# Patient Record
Sex: Female | Born: 1955 | Race: White | Hispanic: No | Smoking: Never smoker
Health system: Southern US, Community
[De-identification: ages and names within clinical notes are randomized; demographics above are authoritative.]

## PROBLEM LIST (undated history)

## (undated) DIAGNOSIS — G5600 Carpal tunnel syndrome, unspecified upper limb: Secondary | ICD-10-CM

## (undated) DIAGNOSIS — G609 Hereditary and idiopathic neuropathy, unspecified: Secondary | ICD-10-CM

## (undated) DIAGNOSIS — M1990 Unspecified osteoarthritis, unspecified site: Secondary | ICD-10-CM

## (undated) DIAGNOSIS — I10 Essential (primary) hypertension: Secondary | ICD-10-CM

## (undated) DIAGNOSIS — E785 Hyperlipidemia, unspecified: Secondary | ICD-10-CM

## (undated) HISTORY — PX: COLONOSCOPY: SHX174

## (undated) HISTORY — PX: TUBAL LIGATION: SHX77

---

## 2013-03-20 ENCOUNTER — Encounter: Payer: Self-pay | Admitting: Family Medicine

## 2017-06-14 ENCOUNTER — Other Ambulatory Visit: Payer: Self-pay | Admitting: Physician Assistant

## 2017-06-14 DIAGNOSIS — M2391 Unspecified internal derangement of right knee: Secondary | ICD-10-CM

## 2017-06-21 ENCOUNTER — Ambulatory Visit: Payer: BLUE CROSS/BLUE SHIELD

## 2017-06-28 ENCOUNTER — Ambulatory Visit: Payer: BLUE CROSS/BLUE SHIELD

## 2017-10-26 ENCOUNTER — Other Ambulatory Visit: Payer: Self-pay | Admitting: Obstetrics & Gynecology

## 2017-10-26 DIAGNOSIS — Z1231 Encounter for screening mammogram for malignant neoplasm of breast: Secondary | ICD-10-CM

## 2017-11-17 ENCOUNTER — Ambulatory Visit
Admission: RE | Admit: 2017-11-17 | Discharge: 2017-11-17 | Disposition: A | Discharge status: Home / Self Care | Payer: BLUE CROSS/BLUE SHIELD | Source: Ambulatory Visit | Attending: Obstetrics & Gynecology | Admitting: Obstetrics & Gynecology

## 2017-11-17 DIAGNOSIS — Z1231 Encounter for screening mammogram for malignant neoplasm of breast: Secondary | ICD-10-CM | POA: Diagnosis not present

## 2017-11-19 ENCOUNTER — Other Ambulatory Visit: Payer: Self-pay | Admitting: *Deleted

## 2017-11-19 ENCOUNTER — Inpatient Hospital Stay
Admission: RE | Admit: 2017-11-19 | Discharge: 2017-11-19 | Disposition: A | Discharge status: Home / Self Care | Payer: Self-pay | Source: Ambulatory Visit | Attending: *Deleted | Admitting: *Deleted

## 2017-11-19 DIAGNOSIS — Z9289 Personal history of other medical treatment: Secondary | ICD-10-CM

## 2018-11-29 ENCOUNTER — Other Ambulatory Visit: Payer: Self-pay | Admitting: Obstetrics & Gynecology

## 2018-11-29 DIAGNOSIS — Z1231 Encounter for screening mammogram for malignant neoplasm of breast: Secondary | ICD-10-CM

## 2018-12-19 ENCOUNTER — Ambulatory Visit
Admission: RE | Admit: 2018-12-19 | Discharge: 2018-12-19 | Disposition: A | Discharge status: Home / Self Care | Payer: BLUE CROSS/BLUE SHIELD | Source: Ambulatory Visit | Attending: Obstetrics & Gynecology | Admitting: Obstetrics & Gynecology

## 2018-12-19 DIAGNOSIS — Z1231 Encounter for screening mammogram for malignant neoplasm of breast: Secondary | ICD-10-CM | POA: Insufficient documentation

## 2019-10-30 ENCOUNTER — Other Ambulatory Visit: Payer: Self-pay

## 2019-10-30 DIAGNOSIS — Z20822 Contact with and (suspected) exposure to covid-19: Secondary | ICD-10-CM

## 2019-10-31 LAB — NOVEL CORONAVIRUS, NAA: SARS-CoV-2, NAA: NOT DETECTED

## 2019-10-31 LAB — INPATIENT

## 2020-10-16 ENCOUNTER — Other Ambulatory Visit: Payer: Self-pay | Admitting: Family Medicine

## 2020-10-16 DIAGNOSIS — Z1231 Encounter for screening mammogram for malignant neoplasm of breast: Secondary | ICD-10-CM

## 2020-11-28 ENCOUNTER — Ambulatory Visit
Admission: RE | Admit: 2020-11-28 | Discharge: 2020-11-28 | Disposition: A | Discharge status: Home / Self Care | Payer: Medicare Other | Source: Ambulatory Visit | Attending: Family Medicine | Admitting: Family Medicine

## 2020-11-28 ENCOUNTER — Other Ambulatory Visit: Payer: Self-pay

## 2020-11-28 DIAGNOSIS — Z1231 Encounter for screening mammogram for malignant neoplasm of breast: Secondary | ICD-10-CM | POA: Insufficient documentation

## 2021-12-11 ENCOUNTER — Other Ambulatory Visit: Payer: Self-pay | Admitting: Family Medicine

## 2021-12-11 DIAGNOSIS — Z1231 Encounter for screening mammogram for malignant neoplasm of breast: Secondary | ICD-10-CM

## 2021-12-24 ENCOUNTER — Other Ambulatory Visit: Payer: Self-pay

## 2021-12-24 ENCOUNTER — Ambulatory Visit
Admission: RE | Admit: 2021-12-24 | Discharge: 2021-12-24 | Disposition: A | Discharge status: Home / Self Care | Payer: Medicare Other | Source: Ambulatory Visit | Attending: Family Medicine | Admitting: Family Medicine

## 2021-12-24 DIAGNOSIS — Z1231 Encounter for screening mammogram for malignant neoplasm of breast: Secondary | ICD-10-CM | POA: Insufficient documentation

## 2022-01-25 NOTE — Discharge Instructions (Signed)
Instructions after Total Knee Replacement   Lashe Oliveira P. Mychelle Kendra, Jr., M.D.     Dept. of Orthopaedics & Sports Medicine  [facility]  [address]  [place], [place]  [address]  Phone: [phone]   Fax: [phone]    DIET: Drink plenty of non-alcoholic fluids. Resume your normal diet. Include foods high in fiber.  ACTIVITY:  You may use crutches or a walker with weight-bearing as tolerated, unless instructed otherwise. You may be weaned off of the walker or crutches by your Physical Therapist.  Do NOT place pillows under the knee. Anything placed under the knee could limit your ability to straighten the knee.   Continue doing gentle exercises. Exercising will reduce the pain and swelling, increase motion, and prevent muscle weakness.   Please continue to use the TED compression stockings for 6 weeks. You may remove the stockings at night, but should reapply them in the morning. Do not drive or operate any equipment until instructed.  WOUND CARE:  Continue to use the PolarCare or ice packs periodically to reduce pain and swelling. You may bathe or shower after the staples are removed at the first office visit following surgery.  MEDICATIONS: You may resume your regular medications. Please take the pain medication as prescribed on the medication. Do not take pain medication on an empty stomach. You have been given a prescription for a blood thinner (Lovenox or Coumadin). Please take the medication as instructed. (NOTE: After completing a 2 week course of Lovenox, take one Enteric-coated aspirin once a day. This along with elevation will help reduce the possibility of phlebitis in your operated leg.) Do not drive or drink alcoholic beverages when taking pain medications.  CALL THE OFFICE FOR: Temperature above 101 degrees Excessive bleeding or drainage on the dressing. Excessive swelling, coldness, or paleness of the toes. Persistent nausea and vomiting.  FOLLOW-UP:  You  should have an appointment to return to the office in 10-14 days after surgery. Arrangements have been made for continuation of Physical Therapy (either home therapy or outpatient therapy).   [facility] Department Directory         [email or web address]       [email or web address]          Cardiology  Appointments: [place] - [phone] [place] - [phone]  Endocrinology  Appointments: [place] - [phone] [place] - [phone]  Gastroenterology  Appointments: [place] - [phone] [place] - [phone]        General Surgery   Appointments: [place] - [phone]  Internal Medicine/Family Medicine  Appointments: [place] - [phone] [place] - [phone] [place] - [phone]  Metabolic and Weigh Loss Surgery  Appointments: [place] - [phone]        Neurology  Appointments: [place] - [phone] [place] - [phone]  Neurosurgery  Appointments: [place] - [phone]  Obstetrics & Gynecology  Appointments: [place] - [phone] [place] - [phone]        Pediatrics  Appointments: [place] - [phone] [place] - [phone]  Physiatry  Appointments: [place] -[phone]  Physical Therapy  Appointments: [place] - [phone] [place] - [phone]        Podiatry  Appointments: [place] - [phone] [place] - [phone]  Pulmonology  Appointments: [place] - [phone]  Rheumatology  Appointments: [place] - [phone]        [place] Location: [facility]  [address] [place], [place]  [address]  [place] Location: [facility] [address] [place], [place]  [address]  [place] Location: [facility] [address] [place], [place]  [address]    

## 2022-02-02 ENCOUNTER — Encounter: Payer: Self-pay | Admitting: Orthopedic Surgery

## 2022-02-02 ENCOUNTER — Other Ambulatory Visit
Admission: RE | Admit: 2022-02-02 | Discharge: 2022-02-02 | Disposition: A | Discharge status: Home / Self Care | Payer: Medicare Other | Source: Ambulatory Visit | Attending: Orthopedic Surgery | Admitting: Orthopedic Surgery

## 2022-02-02 ENCOUNTER — Other Ambulatory Visit: Payer: Self-pay

## 2022-02-02 VITALS — BP 151/83 | HR 72 | Temp 98.6°F | Resp 16 | Ht 61.0 in | Wt 185.9 lb

## 2022-02-02 DIAGNOSIS — Z01818 Encounter for other preprocedural examination: Secondary | ICD-10-CM | POA: Insufficient documentation

## 2022-02-02 DIAGNOSIS — M1711 Unilateral primary osteoarthritis, right knee: Secondary | ICD-10-CM | POA: Insufficient documentation

## 2022-02-02 DIAGNOSIS — Z01812 Encounter for preprocedural laboratory examination: Secondary | ICD-10-CM

## 2022-02-02 HISTORY — DX: Unspecified osteoarthritis, unspecified site: M19.90

## 2022-02-02 LAB — CBC
HCT: 45.1 % (ref 36.0–46.0)
Hemoglobin: 15.4 g/dL — ABNORMAL HIGH (ref 12.0–15.0)
MCH: 32 pg (ref 26.0–34.0)
MCHC: 34.1 g/dL (ref 30.0–36.0)
MCV: 93.6 fL (ref 80.0–100.0)
Platelets: 229 10*3/uL (ref 150–400)
RBC: 4.82 MIL/uL (ref 3.87–5.11)
RDW: 11.9 % (ref 11.5–15.5)
WBC: 7.2 10*3/uL (ref 4.0–10.5)
nRBC: 0 % (ref 0.0–0.2)

## 2022-02-02 LAB — COMPREHENSIVE METABOLIC PANEL
ALT: 23 U/L (ref 0–44)
AST: 24 U/L (ref 15–41)
Albumin: 4.5 g/dL (ref 3.5–5.0)
Alkaline Phosphatase: 54 U/L (ref 38–126)
Anion gap: 8 (ref 5–15)
BUN: 20 mg/dL (ref 8–23)
CO2: 27 mmol/L (ref 22–32)
Calcium: 9.2 mg/dL (ref 8.9–10.3)
Chloride: 103 mmol/L (ref 98–111)
Creatinine, Ser: 0.57 mg/dL (ref 0.44–1.00)
GFR, Estimated: >60 mL/min (ref >60)
Glucose, Bld: 111 mg/dL — ABNORMAL HIGH (ref 70–99)
Potassium: 3.9 mmol/L (ref 3.5–5.1)
Sodium: 138 mmol/L (ref 135–145)
Total Bilirubin: 0.9 mg/dL (ref 0.3–1.2)
Total Protein: 7.7 g/dL (ref 6.5–8.1)

## 2022-02-02 LAB — URINALYSIS, ROUTINE W REFLEX MICROSCOPIC
Bacteria, UA: NONE SEEN
Bilirubin Urine: NEGATIVE
Glucose, UA: NEGATIVE mg/dL
Hgb urine dipstick: NEGATIVE
Ketones, ur: NEGATIVE mg/dL
Nitrite: NEGATIVE
Protein, ur: NEGATIVE mg/dL
Specific Gravity, Urine: 1.015 (ref 1.005–1.030)
pH: 5 (ref 5.0–8.0)

## 2022-02-02 LAB — TYPE AND SCREEN
ABO/RH(D): A POS
Antibody Screen: NEGATIVE

## 2022-02-02 LAB — SEDIMENTATION RATE: Sed Rate: 4 mm/hr (ref 0–30)

## 2022-02-02 LAB — SURGICAL PCR SCREEN
MRSA, PCR: NEGATIVE
Staphylococcus aureus: NEGATIVE

## 2022-02-02 LAB — C-REACTIVE PROTEIN: CRP: 0.5 mg/dL (ref <1.0)

## 2022-02-02 NOTE — Patient Instructions (Addendum)
Your procedure is scheduled on: Friday February 13, 2022. Report to Day Surgery inside [facility] 2nd floor, stop by admissions desk before getting on elevator. To find out your arrival time please call [phone] between 1PM - 3PM on Thursday February 12, 2022.  Remember: Instructions that are not followed completely may result in serious medical risk,  up to and including death, or upon the discretion of your surgeon and anesthesiologist your  surgery may need to be rescheduled.     _X__ 1. Do not eat food after midnight the night before your procedure.                 No chewing gum or hard candies. You may drink clear liquids up to 2 hours                 before you are scheduled to arrive for your surgery- DO not drink clear                 liquids within 2 hours of the start of your surgery.                 Clear Liquids include:  water, apple juice without pulp, clear Gatorade, G2 or                  Gatorade Zero (avoid Red/Purple/Blue), Black Coffee or Tea (Do not add                 anything to coffee or tea).  __X__2.   Complete the "Ensure Clear Pre-surgery Clear Carbohydrate Drink" provided to you, 2 hours before arrival. **If you are diabetic you will be provided with an alternative drink, Gatorade Zero or G2.  __X__3.  On the morning of surgery brush your teeth with toothpaste and water, you                may rinse your mouth with mouthwash if you wish.  Do not swallow any toothpaste of mouthwash.     _X__ 4.  No Alcohol for 24 hours before or after surgery.   _X__ 5.  Do Not Smoke or use e-cigarettes For 24 Hours Prior to Your Surgery.                 Do not use any chewable tobacco products for at least 6 hours prior to                 Surgery.  _X__  6.  Do not use any recreational drugs (marijuana, cocaine, heroin, ecstasy, MDMA or other)                For at least one week prior to your surgery.  Combination of these drugs with anesthesia                 May have life threatening results.  ____  7.  Bring all medications with you on the day of surgery if instructed.   __X__  8.  Notify your doctor if there is any change in your medical condition      (cold, fever, infections).     Do not wear jewelry, make-up, hairpins, clips or nail polish. Do not wear lotions, powders, or perfumes. You may wear deodorant. Do not shave 48 hours prior to surgery. Men may shave face and neck. Do not bring valuables to the hospital.    [facility] is not responsible for any belongings or valuables.  Contacts, dentures or bridgework may not be worn into surgery. Leave your suitcase in the car. After surgery it may be brought to your room. For patients admitted to the hospital, discharge time is determined by your treatment team.   Patients discharged the day of surgery will not be allowed to drive home.   Make arrangements for someone to be with you for the first 24 hours of your Same Day Discharge.    Please read over the following fact sheets that you were given:   Total Joint Packet    __X__ Take these medicines the morning of surgery with A SIP OF WATER:    1. metoprolol succinate (TOPROL-XL) 25 MG   2. oxybutynin (DITROPAN-XL) 10 MG  3.   4.  5.  6.  ____ Fleet Enema (as directed)   __X__ Use CHG Soap (or wipes) as directed  ____ Use Benzoyl Peroxide Gel as instructed  ____ Use inhalers on the day of surgery  ____ Stop metformin 2 days prior to surgery    ____ Take 1/2 of usual insulin dose the night before surgery. No insulin the morning          of surgery.   ____ Call your PCP, cardiologist, or Pulmonologist if taking Coumadin/Plavix/aspirin and ask when to stop before your surgery.   __X__ One Week prior to surgery- Stop Anti-inflammatories such as Ibuprofen, Aleve, Advil, Motrin, meloxicam (MOBIC), diclofenac, etodolac, ketorolac, Toradol, Daypro, piroxicam, Goody's or BC powders. OK TO USE TYLENOL IF  NEEDED   __X__ Do not start any vitamins and or supplements until after surgery.    ____ Bring C-Pap to the hospital.    If you have any questions regarding your pre-procedure instructions,  Please call Pre-admit Testing at [phone]

## 2022-02-11 ENCOUNTER — Other Ambulatory Visit: Payer: Self-pay

## 2022-02-11 ENCOUNTER — Other Ambulatory Visit
Admission: RE | Admit: 2022-02-11 | Discharge: 2022-02-11 | Disposition: A | Discharge status: Home / Self Care | Payer: Medicare Other | Source: Ambulatory Visit | Attending: Orthopedic Surgery | Admitting: Orthopedic Surgery

## 2022-02-11 DIAGNOSIS — Z01812 Encounter for preprocedural laboratory examination: Secondary | ICD-10-CM | POA: Diagnosis present

## 2022-02-11 DIAGNOSIS — Z20822 Contact with and (suspected) exposure to covid-19: Secondary | ICD-10-CM | POA: Diagnosis not present

## 2022-02-11 LAB — SARS CORONAVIRUS 2 (TAT 6-24 HRS): SARS Coronavirus 2: NEGATIVE

## 2022-02-12 ENCOUNTER — Encounter: Payer: Self-pay | Admitting: Orthopedic Surgery

## 2022-02-12 DIAGNOSIS — E785 Hyperlipidemia, unspecified: Secondary | ICD-10-CM | POA: Insufficient documentation

## 2022-02-12 DIAGNOSIS — I10 Essential (primary) hypertension: Secondary | ICD-10-CM | POA: Insufficient documentation

## 2022-02-12 DIAGNOSIS — R0609 Other forms of dyspnea: Secondary | ICD-10-CM | POA: Insufficient documentation

## 2022-02-12 NOTE — H&P (Signed)
ORTHOPAEDIC HISTORY & PHYSICAL ?Wanda Lynch [place], [place] - 02/05/2022 9:45 AM EST ?Formatting of this note is different from the original. ?[place] ?ORTHOPAEDICS AND SPORTS MEDICINE ?Chief Complaint:  ? ?Chief Complaint  ?Patient presents with  ? Knee Pain  ?H & P RIGHT KNEE  ? ?History of Present Illness:  ? ?Wanda Lynch is a 66 y.o. female that presents to clinic today for her preoperative history and evaluation. Patient presents unaccompanied. The patient is scheduled to undergo a right total knee arthroplasty on 02/13/22 by Dr. Marry Guan. Her pain began several years ago. The pain is located primarily along the lateral aspect of the knee. She describes her pain as worse with weightbearing. She reports associated swelling with some giving way of the knee. She denies associated numbness or tingling, denies locking.  ? ?The patient's symptoms have progressed to the point that they decrease her quality of life. The patient has previously undergone conservative treatment including NSAIDS and injections to the knee without adequate control of her symptoms. ? ?Denies history of lumbar surgery, history of DVT. She does see Dr. Saralyn Pilar in cardiology for hypertension. She states her daughter will be coming to stay with her after surgery. ? ?Of note, patient did suffer a right ankle sprain on 11/24/22. She reports some mild discomfort in the right ankle but states it is improved from her initial injury. She is not wearing the brace at this time.  ? ?Past Medical, Surgical, Family, Social History, Allergies, Medications:  ? ?Past Medical History:  ?Past Medical History:  ?Diagnosis Date  ? Allergic state  ? Arthritis  ?Hands/carpal tunnel  ? Carpal tunnel syndrome  ? Exertional dyspnea  ? Hypertension  ?Mild  ? Other and unspecified hyperlipidemia  ? Unspecified hereditary and idiopathic peripheral neuropathy  ? Urinary frequency  ? ?Past Surgical History:  ?Past Surgical History:  ?Procedure Laterality Date   ? TONSILLECTOMY  ? TUBAL LIGATION  ? ?Current Medications:  ?Current Outpatient Medications  ?Medication Sig Dispense Refill  ? acetaminophen (TYLENOL) 650 MG ER tablet Take 1,300 mg by mouth every 8 (eight) hours as needed for Pain  ? calcium carbonate-vitamin D3 (OS-CAL 500+D) 500 mg(1,250mg ) -200 unit tablet Take 1 tablet by mouth 2 (two) times daily with meals  ? ibuprofen (MOTRIN) 200 MG tablet Take 1 tablet (200 mg total) by mouth every 6 (six) hours as needed for Pain  ? ketotifen (ZADITOR) 0.025 % (0.035 %) ophthalmic solution Apply 1 drop to eye once daily  ? meloxicam (MOBIC) 7.5 MG tablet Take 1 tablet (7.5 mg total) by mouth 2 (two) times daily 180 tablet 3  ? methocarbamoL (ROBAXIN) 750 MG tablet Take 750 mg by mouth 4 (four) times daily  ? metoprolol succinate (TOPROL-XL) 50 MG XL tablet Take 1 tablet (50 mg total) by mouth once daily 90 tablet 3  ? oxybutynin (DITROPAN-XL) 10 MG XL tablet TAKE ONE TABLET BY MOUTH DAILY 90 tablet 3  ? peg 400-propylene glycol (SYSTANE ULTRA) 0.4-0.3 % drops Place 1 drop into both eyes once daily  ? propylhexedrine (BENZEDREX) Inha Place 1 spray into one nostril once as needed  ? simvastatin (ZOCOR) 20 MG tablet Take 1 tablet (20 mg total) by mouth once daily 90 tablet 3  ? ?No current facility-administered medications for this visit.  ? ?Allergies:  ?Allergies  ?Allergen Reactions  ? Mometasone Other (See Comments)  ?Nose irritation ?Other reaction(s): Other (See Comments) ?Nose irritation  ? ?Social History:  ?Social History  ? ?  Socioeconomic History  ? Marital status: Widowed  ? Number of children: 3  ?Occupational History  ? Occupation: Retired  ?Tobacco Use  ? Smoking status: Never  ? Smokeless tobacco: Never  ?Vaping Use  ? Vaping Use: Never used  ?Substance and Sexual Activity  ? Alcohol use: No  ?Alcohol/week: 0.0 standard drinks  ? Drug use: No  ? Sexual activity: Defer  ?Partners: Male  ?Birth control/protection: Surgical  ? ?Family History:  ?Family History   ?Problem Relation Age of Onset  ? High blood pressure (Hypertension) Mother  ? Osteoporosis (Thinning of bones) Mother  ? Diabetes type II Mother  ? Hyperlipidemia (Elevated cholesterol) Mother  ? Atrial fibrillation (Abnormal heart rhythm sometimes requiring treatment with blood thinners) Father  ? ?Review of Systems:  ? ?A 10+ ROS was performed, reviewed, and the pertinent orthopaedic findings are documented in the HPI.  ? ?Physical Examination:  ? ?BP (!) 144/78 (BP Location: Left upper arm, Patient Position: Sitting, BP Cuff Size: Large Adult)  Ht 157.5 cm (5\' 2" )  Wt 84.2 kg (185 lb 9.6 oz)  BMI 33.95 kg/m?  ? ?Patient is a well-developed, well-nourished female in no acute distress. Patient has normal mood and affect. Patient is alert and oriented to person, place, and time.  ? ?HEENT: Atraumatic, normocephalic. Pupils equal and reactive to light. Extraocular motion intact. Noninjected sclera. ? ?Cardiovascular: Regular rate and rhythm, with no murmurs, rubs, or gallops. Distal pulses palpable. ? ?Respiratory: Lungs clear to auscultation bilaterally.  ? ?Right Knee: ?Soft tissue swelling: mild ?Effusion: none ?Erythema: none ?Crepitance: mild ?Tenderness: lateral ?Alignment: relative valgus ?Mediolateral laxity: lateral pseudolaxity ?Posterior sag: negative ?Patellar tracking: Good tracking without evidence of subluxation or tilt ?Atrophy: No significant atrophy.  ?Quadriceps tone was fair to good. ?Range of motion: 0/3/118 degrees ? ?Sensation intact over the saphenous, lateral sural cutaneous, superficial fibular, and deep fibular nerve distributions. ? ?Tests Performed/Reviewed:  ?X-rays ? ?Anteroposterior, lateral, and sunrise views of the right knee were obtained. Images reveal severe loss of lateral compartment joint space with significant osteophyte formation. No fractures or osseous abnormalities noted.  ? ?I personally ordered and interpreted today's xrays.  ? ?Impression:  ? ?ICD-10-CM  ?1.  Primary osteoarthritis of right knee M17.11  ? ?Plan:  ? ?The patient has end-stage degenerative changes of the right knee. It was explained to the patient that the condition is progressive in nature. Having failed conservative treatment, the patient has elected to proceed with a total joint arthroplasty. The patient will undergo a total joint arthroplasty with Dr. Marry Guan. The risks of surgery, including blood clot and infection, were discussed with the patient. Measures to reduce these risks, including the use of anticoagulation, perioperative antibiotics, and early ambulation were discussed. The importance of postoperative physical therapy was discussed with the patient. The patient elects to proceed with surgery. The patient is instructed to stop all blood thinners prior to surgery. The patient is instructed to call the hospital the day before surgery to learn of the proper arrival time.  ? ?Contact our office with any questions or concerns. Follow up as indicated, or sooner should any new problems arise, if conditions worsen, or if they are otherwise concerned.  ? ?Gwenlyn Fudge, PA -C ?[facility] ?[address] ?[place], [place] [address] ?Phone: [phone] ? ?This note was generated in part with voice recognition software and I apologize for any typographical errors that were not detected and corrected. ? ?Electronically signed by Wanda Lynch,  Gaspar Bidding, PA at 02/08/2022 11:28 AM EST ? ?

## 2022-02-13 ENCOUNTER — Observation Stay: Payer: Medicare Other

## 2022-02-13 ENCOUNTER — Other Ambulatory Visit: Payer: Self-pay

## 2022-02-13 ENCOUNTER — Encounter: Payer: Self-pay | Admitting: Orthopedic Surgery

## 2022-02-13 ENCOUNTER — Observation Stay
Admission: RE | Admit: 2022-02-13 | Discharge: 2022-02-15 | Disposition: A | Discharge status: Home Health | Payer: Medicare Other | Attending: Orthopedic Surgery | Admitting: Orthopedic Surgery

## 2022-02-13 ENCOUNTER — Encounter
Admission: RE | Disposition: A | Discharge status: Home Health | Payer: Self-pay | Source: Home / Self Care | Attending: Orthopedic Surgery

## 2022-02-13 ENCOUNTER — Ambulatory Visit: Payer: Medicare Other | Admitting: Urgent Care

## 2022-02-13 DIAGNOSIS — M1711 Unilateral primary osteoarthritis, right knee: Secondary | ICD-10-CM | POA: Diagnosis not present

## 2022-02-13 DIAGNOSIS — E785 Hyperlipidemia, unspecified: Secondary | ICD-10-CM | POA: Insufficient documentation

## 2022-02-13 DIAGNOSIS — I10 Essential (primary) hypertension: Secondary | ICD-10-CM | POA: Diagnosis not present

## 2022-02-13 DIAGNOSIS — Z79899 Other long term (current) drug therapy: Secondary | ICD-10-CM | POA: Insufficient documentation

## 2022-02-13 DIAGNOSIS — Z96659 Presence of unspecified artificial knee joint: Secondary | ICD-10-CM

## 2022-02-13 HISTORY — DX: Carpal tunnel syndrome, unspecified upper limb: G56.00

## 2022-02-13 HISTORY — PX: KNEE ARTHROPLASTY: SHX992

## 2022-02-13 HISTORY — DX: Essential (primary) hypertension: I10

## 2022-02-13 HISTORY — DX: Hereditary and idiopathic neuropathy, unspecified: G60.9

## 2022-02-13 HISTORY — DX: Hyperlipidemia, unspecified: E78.5

## 2022-02-13 LAB — ABO/RH: ABO/RH(D): A POS

## 2022-02-13 SURGERY — ARTHROPLASTY, KNEE, TOTAL, USING IMAGELESS COMPUTER-ASSISTED NAVIGATION
Anesthesia: Spinal | Site: Knee | Laterality: Right

## 2022-02-13 MED ORDER — FLEET ENEMA 7-19 GM/118ML RE ENEM: 1.0000 | ENEMA | Freq: Once | RECTAL | Status: DC | PRN

## 2022-02-13 MED ORDER — SENNOSIDES-DOCUSATE SODIUM 8.6-50 MG PO TABS
1.0000 | ORAL_TABLET | Freq: Two times a day (BID) | ORAL | Status: DC
  Administered 2022-02-13 – 2022-02-15 (×4): 1 via ORAL
  Filled 2022-02-13 (×4): qty 1

## 2022-02-13 MED ORDER — ACETAMINOPHEN 325 MG PO TABS
325.0000 mg | ORAL_TABLET | Freq: Four times a day (QID) | ORAL | Status: DC | PRN
  Administered 2022-02-15: 325 mg via ORAL
  Filled 2022-02-13: qty 1

## 2022-02-13 MED ORDER — MIDAZOLAM HCL 2 MG/2ML IJ SOLN
INTRAMUSCULAR | Status: AC
  Filled 2022-02-13: qty 2

## 2022-02-13 MED ORDER — PROPOFOL 1000 MG/100ML IV EMUL
INTRAVENOUS | Status: AC
  Filled 2022-02-13: qty 100

## 2022-02-13 MED ORDER — ONDANSETRON HCL 4 MG PO TABS: 4.0000 mg | ORAL_TABLET | Freq: Four times a day (QID) | ORAL | Status: DC | PRN

## 2022-02-13 MED ORDER — BISACODYL 10 MG RE SUPP: 10.0000 mg | Freq: Every day | RECTAL | Status: DC | PRN

## 2022-02-13 MED ORDER — ONDANSETRON HCL 4 MG/2ML IJ SOLN: 4.0000 mg | Freq: Once | INTRAMUSCULAR | Status: DC | PRN

## 2022-02-13 MED ORDER — ONDANSETRON HCL 4 MG/2ML IJ SOLN
INTRAMUSCULAR | Status: DC | PRN
  Administered 2022-02-13: 4 mg via INTRAVENOUS

## 2022-02-13 MED ORDER — BUPIVACAINE IN DEXTROSE 0.75-8.25 % IT SOLN
INTRATHECAL | Status: DC | PRN
  Administered 2022-02-13: 3 mL via INTRATHECAL

## 2022-02-13 MED ORDER — CEFAZOLIN SODIUM-DEXTROSE 2-4 GM/100ML-% IV SOLN
2.0000 g | INTRAVENOUS | Status: AC
  Administered 2022-02-13: 2 g via INTRAVENOUS

## 2022-02-13 MED ORDER — DEXAMETHASONE SODIUM PHOSPHATE 10 MG/ML IJ SOLN: 8.0000 mg | Freq: Once | INTRAMUSCULAR | Status: AC

## 2022-02-13 MED ORDER — POLYVINYL ALCOHOL 1.4 % OP SOLN
1.0000 [drp] | OPHTHALMIC | Status: DC | PRN
  Filled 2022-02-13: qty 15

## 2022-02-13 MED ORDER — METOCLOPRAMIDE HCL 10 MG PO TABS
10.0000 mg | ORAL_TABLET | Freq: Three times a day (TID) | ORAL | Status: DC
  Administered 2022-02-13 – 2022-02-14 (×5): 10 mg via ORAL
  Filled 2022-02-13 (×6): qty 1

## 2022-02-13 MED ORDER — PRONTOSAN WOUND IRRIGATION OPTIME
TOPICAL | Status: DC | PRN
  Administered 2022-02-13: 1

## 2022-02-13 MED ORDER — METHOCARBAMOL 500 MG PO TABS: 500.0000 mg | ORAL_TABLET | Freq: Every day | ORAL | Status: DC | PRN

## 2022-02-13 MED ORDER — PHENOL 1.4 % MT LIQD
1.0000 | OROMUCOSAL | Status: DC | PRN
  Filled 2022-02-13: qty 177

## 2022-02-13 MED ORDER — CEFAZOLIN SODIUM-DEXTROSE 2-4 GM/100ML-% IV SOLN
2.0000 g | Freq: Four times a day (QID) | INTRAVENOUS | Status: AC
  Administered 2022-02-14: 2 g via INTRAVENOUS
  Filled 2022-02-13 (×2): qty 100

## 2022-02-13 MED ORDER — PROPOFOL 500 MG/50ML IV EMUL
INTRAVENOUS | Status: DC | PRN
Start: 2022-02-13 — End: 2022-02-13
  Administered 2022-02-13: 50 ug/kg/min via INTRAVENOUS

## 2022-02-13 MED ORDER — ORAL CARE MOUTH RINSE: 15.0000 mL | Freq: Once | OROMUCOSAL | Status: AC

## 2022-02-13 MED ORDER — ENOXAPARIN SODIUM 30 MG/0.3ML IJ SOSY
30.0000 mg | PREFILLED_SYRINGE | Freq: Two times a day (BID) | INTRAMUSCULAR | Status: DC
  Administered 2022-02-14 – 2022-02-15 (×3): 30 mg via SUBCUTANEOUS
  Filled 2022-02-13 (×3): qty 0.3

## 2022-02-13 MED ORDER — CHLORHEXIDINE GLUCONATE 4 % EX LIQD: 60.0000 mL | Freq: Once | CUTANEOUS | Status: DC

## 2022-02-13 MED ORDER — LACTATED RINGERS IV SOLN
INTRAVENOUS | Status: DC
Start: 2022-02-13 — End: 2022-02-13

## 2022-02-13 MED ORDER — PHENYLEPHRINE HCL-NACL 20-0.9 MG/250ML-% IV SOLN
INTRAVENOUS | Status: DC | PRN
Start: 2022-02-13 — End: 2022-02-13
  Administered 2022-02-13: 20 ug/min via INTRAVENOUS

## 2022-02-13 MED ORDER — OXYCODONE HCL 5 MG PO TABS
5.0000 mg | ORAL_TABLET | ORAL | Status: DC | PRN
  Administered 2022-02-15: 5 mg via ORAL
  Filled 2022-02-13 (×3): qty 1

## 2022-02-13 MED ORDER — DEXAMETHASONE SODIUM PHOSPHATE 10 MG/ML IJ SOLN
INTRAMUSCULAR | Status: AC
  Administered 2022-02-13: 8 mg via INTRAVENOUS
  Filled 2022-02-13: qty 1

## 2022-02-13 MED ORDER — ACETAMINOPHEN 10 MG/ML IV SOLN
INTRAVENOUS | Status: AC
  Filled 2022-02-13: qty 100

## 2022-02-13 MED ORDER — GABAPENTIN 300 MG PO CAPS
ORAL_CAPSULE | ORAL | Status: AC
  Administered 2022-02-13: 300 mg via ORAL
  Filled 2022-02-13: qty 1

## 2022-02-13 MED ORDER — SIMVASTATIN 20 MG PO TABS
20.0000 mg | ORAL_TABLET | Freq: Every day | ORAL | Status: DC
Start: 2022-02-13 — End: 2022-02-15
  Administered 2022-02-13 – 2022-02-14 (×2): 20 mg via ORAL
  Filled 2022-02-13 (×2): qty 1

## 2022-02-13 MED ORDER — DIPHENHYDRAMINE HCL 12.5 MG/5ML PO ELIX: 12.5000 mg | ORAL_SOLUTION | ORAL | Status: DC | PRN

## 2022-02-13 MED ORDER — TRANEXAMIC ACID-NACL 1000-0.7 MG/100ML-% IV SOLN
INTRAVENOUS | Status: AC
  Administered 2022-02-13: 1000 mg via INTRAVENOUS
  Filled 2022-02-13: qty 100

## 2022-02-13 MED ORDER — 0.9 % SODIUM CHLORIDE (POUR BTL) OPTIME
TOPICAL | Status: DC | PRN
  Administered 2022-02-13: 500 mL

## 2022-02-13 MED ORDER — GABAPENTIN 300 MG PO CAPS: 300.0000 mg | ORAL_CAPSULE | Freq: Once | ORAL | Status: AC

## 2022-02-13 MED ORDER — ONDANSETRON HCL 4 MG/2ML IJ SOLN
INTRAMUSCULAR | Status: AC
  Filled 2022-02-13: qty 2

## 2022-02-13 MED ORDER — TRAMADOL HCL 50 MG PO TABS: 50.0000 mg | ORAL_TABLET | ORAL | Status: DC | PRN

## 2022-02-13 MED ORDER — FAMOTIDINE 20 MG PO TABS
ORAL_TABLET | ORAL | Status: AC
  Administered 2022-02-13: 20 mg via ORAL
  Filled 2022-02-13: qty 1

## 2022-02-13 MED ORDER — FERROUS SULFATE 325 (65 FE) MG PO TABS
325.0000 mg | ORAL_TABLET | Freq: Two times a day (BID) | ORAL | Status: DC
  Administered 2022-02-14 – 2022-02-15 (×3): 325 mg via ORAL
  Filled 2022-02-13 (×3): qty 1

## 2022-02-13 MED ORDER — MENTHOL 3 MG MT LOZG
1.0000 | LOZENGE | OROMUCOSAL | Status: DC | PRN
  Filled 2022-02-13 (×2): qty 9

## 2022-02-13 MED ORDER — GLYCOPYRROLATE 0.2 MG/ML IJ SOLN
INTRAMUSCULAR | Status: AC
  Filled 2022-02-13: qty 1

## 2022-02-13 MED ORDER — PHENYLEPHRINE HCL-NACL 20-0.9 MG/250ML-% IV SOLN
INTRAVENOUS | Status: AC
  Filled 2022-02-13: qty 250

## 2022-02-13 MED ORDER — TRANEXAMIC ACID-NACL 1000-0.7 MG/100ML-% IV SOLN
1000.0000 mg | INTRAVENOUS | Status: AC
  Administered 2022-02-13: 1000 mg via INTRAVENOUS

## 2022-02-13 MED ORDER — PANTOPRAZOLE SODIUM 40 MG PO TBEC
40.0000 mg | DELAYED_RELEASE_TABLET | Freq: Two times a day (BID) | ORAL | Status: DC
  Administered 2022-02-13 – 2022-02-15 (×4): 40 mg via ORAL
  Filled 2022-02-13 (×4): qty 1

## 2022-02-13 MED ORDER — MAGNESIUM HYDROXIDE 400 MG/5ML PO SUSP
30.0000 mL | Freq: Every day | ORAL | Status: DC
  Administered 2022-02-13 – 2022-02-15 (×3): 30 mL via ORAL
  Filled 2022-02-13 (×3): qty 30

## 2022-02-13 MED ORDER — TRANEXAMIC ACID-NACL 1000-0.7 MG/100ML-% IV SOLN
INTRAVENOUS | Status: AC
  Filled 2022-02-13: qty 100

## 2022-02-13 MED ORDER — CHLORHEXIDINE GLUCONATE 0.12 % MT SOLN: 15.0000 mL | Freq: Once | OROMUCOSAL | Status: AC

## 2022-02-13 MED ORDER — ACETAMINOPHEN 10 MG/ML IV SOLN
1000.0000 mg | Freq: Four times a day (QID) | INTRAVENOUS | Status: AC
  Administered 2022-02-13 – 2022-02-14 (×3): 1000 mg via INTRAVENOUS
  Filled 2022-02-13 (×3): qty 100

## 2022-02-13 MED ORDER — CELECOXIB 200 MG PO CAPS
200.0000 mg | ORAL_CAPSULE | Freq: Two times a day (BID) | ORAL | Status: DC
  Administered 2022-02-13 – 2022-02-15 (×4): 200 mg via ORAL
  Filled 2022-02-13 (×4): qty 1

## 2022-02-13 MED ORDER — ENSURE PRE-SURGERY PO LIQD
296.0000 mL | Freq: Once | ORAL | Status: DC
  Filled 2022-02-13: qty 296

## 2022-02-13 MED ORDER — TRANEXAMIC ACID-NACL 1000-0.7 MG/100ML-% IV SOLN: 1000.0000 mg | Freq: Once | INTRAVENOUS | Status: AC

## 2022-02-13 MED ORDER — FENTANYL CITRATE (PF) 100 MCG/2ML IJ SOLN
INTRAMUSCULAR | Status: AC
Start: 2022-02-13 — End: 2022-02-13
  Administered 2022-02-13: 50 ug via INTRAVENOUS
  Filled 2022-02-13: qty 2

## 2022-02-13 MED ORDER — KETOTIFEN FUMARATE 0.025 % OP SOLN
1.0000 [drp] | Freq: Every day | OPHTHALMIC | Status: DC
  Administered 2022-02-14 – 2022-02-15 (×2): 1 [drp] via OPHTHALMIC
  Filled 2022-02-13: qty 5

## 2022-02-13 MED ORDER — OXYBUTYNIN CHLORIDE ER 5 MG PO TB24
10.0000 mg | ORAL_TABLET | Freq: Every day | ORAL | Status: DC
  Administered 2022-02-14 – 2022-02-15 (×2): 10 mg via ORAL
  Filled 2022-02-13 (×3): qty 2

## 2022-02-13 MED ORDER — POLYETHYL GLYCOL-PROPYL GLYCOL 0.4-0.3 % OP SOLN: 1.0000 [drp] | Freq: Every day | OPHTHALMIC | Status: DC

## 2022-02-13 MED ORDER — SODIUM CHLORIDE 0.9 % IV SOLN: INTRAVENOUS | Status: DC

## 2022-02-13 MED ORDER — METOPROLOL SUCCINATE ER 25 MG PO TB24
25.0000 mg | ORAL_TABLET | Freq: Every day | ORAL | Status: DC
  Administered 2022-02-14 – 2022-02-15 (×2): 25 mg via ORAL
  Filled 2022-02-13 (×3): qty 1

## 2022-02-13 MED ORDER — ACETAMINOPHEN 10 MG/ML IV SOLN
INTRAVENOUS | Status: DC | PRN
Start: 2022-02-13 — End: 2022-02-13
  Administered 2022-02-13: 1000 mg via INTRAVENOUS

## 2022-02-13 MED ORDER — HYDROMORPHONE HCL 1 MG/ML IJ SOLN: 0.5000 mg | INTRAMUSCULAR | Status: DC | PRN

## 2022-02-13 MED ORDER — ONDANSETRON HCL 4 MG/2ML IJ SOLN: 4.0000 mg | Freq: Four times a day (QID) | INTRAMUSCULAR | Status: DC | PRN

## 2022-02-13 MED ORDER — OXYCODONE HCL 5 MG PO TABS
10.0000 mg | ORAL_TABLET | ORAL | Status: DC | PRN
  Administered 2022-02-13 – 2022-02-15 (×3): 10 mg via ORAL
  Filled 2022-02-13 (×3): qty 2

## 2022-02-13 MED ORDER — MIDAZOLAM HCL 5 MG/5ML IJ SOLN
INTRAMUSCULAR | Status: DC | PRN
Start: 2022-02-13 — End: 2022-02-13
  Administered 2022-02-13: 2 mg via INTRAVENOUS

## 2022-02-13 MED ORDER — FENTANYL CITRATE (PF) 100 MCG/2ML IJ SOLN
25.0000 ug | INTRAMUSCULAR | Status: DC | PRN
  Administered 2022-02-13: 50 ug via INTRAVENOUS

## 2022-02-13 MED ORDER — SODIUM CHLORIDE (PF) 0.9 % IJ SOLN
INTRAMUSCULAR | Status: DC | PRN
  Administered 2022-02-13: 120 mL via INTRA_ARTICULAR

## 2022-02-13 MED ORDER — CHLORHEXIDINE GLUCONATE 0.12 % MT SOLN
OROMUCOSAL | Status: AC
  Administered 2022-02-13: 15 mL via OROMUCOSAL
  Filled 2022-02-13: qty 15

## 2022-02-13 MED ORDER — CELECOXIB 200 MG PO CAPS: 400.0000 mg | ORAL_CAPSULE | Freq: Once | ORAL | Status: AC

## 2022-02-13 MED ORDER — FAMOTIDINE 20 MG PO TABS
20.0000 mg | ORAL_TABLET | Freq: Once | ORAL | Status: AC
Start: 2022-02-13 — End: 2022-02-13

## 2022-02-13 MED ORDER — CELECOXIB 200 MG PO CAPS
ORAL_CAPSULE | ORAL | Status: AC
  Administered 2022-02-13: 400 mg via ORAL
  Filled 2022-02-13: qty 2

## 2022-02-13 MED ORDER — SODIUM CHLORIDE 0.9 % IR SOLN
Status: DC | PRN
  Administered 2022-02-13: 3000 mL

## 2022-02-13 MED ORDER — ALUM & MAG HYDROXIDE-SIMETH 200-200-20 MG/5ML PO SUSP: 30.0000 mL | ORAL | Status: DC | PRN

## 2022-02-13 MED ORDER — GLYCOPYRROLATE 0.2 MG/ML IJ SOLN
INTRAMUSCULAR | Status: DC | PRN
  Administered 2022-02-13: .2 mg via INTRAVENOUS

## 2022-02-13 MED ORDER — CEFAZOLIN SODIUM-DEXTROSE 2-4 GM/100ML-% IV SOLN
INTRAVENOUS | Status: AC
  Administered 2022-02-13: 2 g via INTRAVENOUS
  Filled 2022-02-13: qty 100

## 2022-02-13 SURGICAL SUPPLY — 76 items
ATTUNE MED DOME PAT 38 KNEE (Knees) ×1 IMPLANT
ATTUNE PS FEM RT SZ 4 CEM KNEE (Femur) ×1 IMPLANT
ATTUNE PSRP INSR SZ4 7 KNEE (Insert) ×1 IMPLANT
BASEPLATE TIBIAL ROTATING SZ 4 (Knees) ×1 IMPLANT
BATTERY INSTRU NAVIGATION (MISCELLANEOUS) ×8 IMPLANT
BLADE SAW 70X12.5 (BLADE) ×2 IMPLANT
BLADE SAW 90X13X1.19 OSCILLAT (BLADE) ×2 IMPLANT
BLADE SAW 90X25X1.19 OSCILLAT (BLADE) ×2 IMPLANT
BONE CEMENT GENTAMICIN (Cement) ×4 IMPLANT
BOWL CEMENT MIXING ADV NOZZLE (MISCELLANEOUS) ×1 IMPLANT
CEMENT BONE GENTAMICIN 40 (Cement) IMPLANT
COOLER POLAR GLACIER W/PUMP (MISCELLANEOUS) ×2 IMPLANT
CUFF TOURN SGL QUICK 24 (TOURNIQUET CUFF)
CUFF TOURN SGL QUICK 34 (TOURNIQUET CUFF) ×1
CUFF TRNQT CYL 24X4X16.5-23 (TOURNIQUET CUFF) IMPLANT
CUFF TRNQT CYL 34X4.125X (TOURNIQUET CUFF) IMPLANT
DRAPE 3/4 80X56 (DRAPES) ×2 IMPLANT
DRAPE INCISE IOBAN 66X45 STRL (DRAPES) ×2 IMPLANT
DRSG DERMACEA 8X12 NADH (GAUZE/BANDAGES/DRESSINGS) ×2 IMPLANT
DRSG MEPILEX SACRM 8.7X9.8 (GAUZE/BANDAGES/DRESSINGS) ×2 IMPLANT
DRSG OPSITE POSTOP 4X14 (GAUZE/BANDAGES/DRESSINGS) ×2 IMPLANT
DRSG TEGADERM 4X4.75 (GAUZE/BANDAGES/DRESSINGS) ×2 IMPLANT
DURAPREP 26ML APPLICATOR (WOUND CARE) ×4 IMPLANT
ELECT CAUTERY BLADE 6.4 (BLADE) ×2 IMPLANT
ELECT REM PT RETURN 9FT ADLT (ELECTROSURGICAL) ×2
ELECTRODE REM PT RTRN 9FT ADLT (ELECTROSURGICAL) ×1 IMPLANT
EX-PIN ORTHOLOCK NAV 4X150 (PIN) ×4 IMPLANT
GLOVE SRG 8 PF TXTR STRL LF DI (GLOVE) ×1 IMPLANT
GLOVE SURG ENC TEXT LTX SZ7.5 (GLOVE) ×8 IMPLANT
GLOVE SURG UNDER POLY LF SZ7.5 (GLOVE) ×2 IMPLANT
GLOVE SURG UNDER POLY LF SZ8 (GLOVE) ×1
GOWN STRL REUS W/ TWL LRG LVL3 (GOWN DISPOSABLE) ×2 IMPLANT
GOWN STRL REUS W/ TWL XL LVL3 (GOWN DISPOSABLE) ×1 IMPLANT
GOWN STRL REUS W/TWL LRG LVL3 (GOWN DISPOSABLE) ×2
GOWN STRL REUS W/TWL XL LVL3 (GOWN DISPOSABLE) ×1
HEMOVAC 400CC 10FR (MISCELLANEOUS) ×2 IMPLANT
HOLDER FOLEY CATH W/STRAP (MISCELLANEOUS) ×2 IMPLANT
HOLSTER ELECTROSUGICAL PENCIL (MISCELLANEOUS) ×2 IMPLANT
IV NS IRRIG 3000ML ARTHROMATIC (IV SOLUTION) ×2 IMPLANT
KIT TURNOVER KIT A (KITS) ×2 IMPLANT
KNIFE SCULPS 14X20 (INSTRUMENTS) ×2 IMPLANT
LABEL OR SOLS (LABEL) ×1 IMPLANT
MANIFOLD NEPTUNE II (INSTRUMENTS) ×4 IMPLANT
NDL SAFETY ECLIPSE 18X1.5 (NEEDLE) ×1 IMPLANT
NDL SPNL 20GX3.5 QUINCKE YW (NEEDLE) ×2 IMPLANT
NEEDLE HYPO 18GX1.5 SHARP (NEEDLE)
NEEDLE SPNL 20GX3.5 QUINCKE YW (NEEDLE) ×4 IMPLANT
NS IRRIG 500ML POUR BTL (IV SOLUTION) ×2 IMPLANT
PACK TOTAL KNEE (MISCELLANEOUS) ×2 IMPLANT
PAD ABD DERMACEA PRESS 5X9 (GAUZE/BANDAGES/DRESSINGS) ×4 IMPLANT
PAD WRAPON POLAR KNEE (MISCELLANEOUS) ×1 IMPLANT
PENCIL SMOKE EVACUATOR COATED (MISCELLANEOUS) ×2 IMPLANT
PIN DRILL FIX HALF THREAD (BIT) ×4 IMPLANT
PIN DRILL QUICK PACK ×4 IMPLANT
PIN FIXATION 1/8DIA X 3INL (PIN) ×2 IMPLANT
PULSAVAC PLUS IRRIG FAN TIP (DISPOSABLE) ×2
SOL PREP PVP 2OZ (MISCELLANEOUS) ×2
SOLUTION PREP PVP 2OZ (MISCELLANEOUS) ×1 IMPLANT
SOLUTION PRONTOSAN WOUND 350ML (IRRIGATION / IRRIGATOR) ×2 IMPLANT
SPONGE DRAIN TRACH 4X4 STRL 2S (GAUZE/BANDAGES/DRESSINGS) ×2 IMPLANT
STAPLER SKIN PROX 35W (STAPLE) ×2 IMPLANT
STOCKINETTE IMPERV 14X48 (MISCELLANEOUS) IMPLANT
STRAP TIBIA SHORT (MISCELLANEOUS) ×2 IMPLANT
SUCTION FRAZIER HANDLE 10FR (MISCELLANEOUS) ×1
SUCTION TUBE FRAZIER 10FR DISP (MISCELLANEOUS) ×1 IMPLANT
SUT VIC AB 0 CT1 36 (SUTURE) ×4 IMPLANT
SUT VIC AB 1 CT1 36 (SUTURE) ×4 IMPLANT
SUT VIC AB 2-0 CT2 27 (SUTURE) ×2 IMPLANT
SYR 20ML LL LF (SYRINGE) ×2 IMPLANT
SYR 30ML LL (SYRINGE) ×4 IMPLANT
TIP FAN IRRIG PULSAVAC PLUS (DISPOSABLE) ×1 IMPLANT
TOWEL OR 17X26 4PK STRL BLUE (TOWEL DISPOSABLE) ×1 IMPLANT
TOWER CARTRIDGE SMART MIX (DISPOSABLE) ×2 IMPLANT
TRAY FOLEY MTR SLVR 16FR STAT (SET/KITS/TRAYS/PACK) ×2 IMPLANT
WATER STERILE IRR 1000ML POUR (IV SOLUTION) ×2 IMPLANT
WRAPON POLAR PAD KNEE (MISCELLANEOUS) ×2

## 2022-02-13 NOTE — Anesthesia Postprocedure Evaluation (Signed)
Anesthesia Post Note ? ?Patient: Wanda Lynch ? ?Procedure(s) Performed: COMPUTER ASSISTED TOTAL KNEE ARTHROPLASTY (Right: Knee) ? ?Patient location during evaluation: PACU ?Anesthesia Type: Spinal ?Level of consciousness: oriented and awake and alert ?Pain management: pain level controlled ?Vital Signs Assessment: post-procedure vital signs reviewed and stable ?Respiratory status: spontaneous breathing, respiratory function stable and patient connected to nasal cannula oxygen ?Cardiovascular status: blood pressure returned to baseline and stable ?Postop Assessment: no headache, no backache and no apparent nausea or vomiting ?Anesthetic complications: no ? ? ?No notable events documented. ? ? ?Last Vitals:  ?Vitals:  ? 02/13/22 1815 02/13/22 1830  ?BP: 131/82 124/69  ?Pulse: 70 76  ?Resp: 20 16  ?Temp:  (!) 36.3 ?C  ?SpO2: 98% 96%  ?  ?Last Pain:  ?Vitals:  ? 02/13/22 1830  ?TempSrc:   ?PainSc: 0-No pain  ? ? ?  ?  ?  ?  ?  ?  ? ?Bobie Kistler ? ? ? ? ?

## 2022-02-13 NOTE — Op Note (Signed)
OPERATIVE NOTE ? ?DATE OF SURGERY:  02/13/2022 ? ?PATIENT NAME:  Wanda Lynch   ?DOB: 1956-04-28  ?MRN: [id number] ? ?PRE-OPERATIVE DIAGNOSIS: Degenerative arthrosis of the right knee, primary ? ?POST-OPERATIVE DIAGNOSIS:  Same ? ?PROCEDURE:  Right total knee arthroplasty using computer-assisted navigation ? ?SURGEON:  Marciano Sequin. M.D. ? ?ANESTHESIA: spinal ? ?ESTIMATED BLOOD LOSS: 50 mL ? ?FLUIDS REPLACED: 800 mL of crystalloid ? ?TOURNIQUET TIME: 102 minutes ? ?DRAINS: 2 medium Hemovac drains ? ?SOFT TISSUE RELEASES: Anterior cruciate ligament, posterior cruciate ligament, deep  medial collateral ligament, patellofemoral ligament, and posterolateral corner ? ?IMPLANTS UTILIZED: DePuy Attune size 4 posterior stabilized femoral component (cemented), size 4 rotating platform tibial component (cemented), 38 mm medialized dome patella (cemented), and a 7 mm stabilized rotating platform polyethylene insert. ? ?INDICATIONS FOR SURGERY: Wanda Lynch is a 66 y.o. year old female with a long history of progressive knee pain. X-rays demonstrated severe degenerative changes in tricompartmental fashion. The patient had not seen any significant improvement despite conservative nonsurgical intervention. After discussion of the risks and benefits of surgical intervention, the patient expressed understanding of the risks benefits and agree with plans for total knee arthroplasty.  ? ?The risks, benefits, and alternatives were discussed at length including but not limited to the risks of infection, bleeding, nerve injury, stiffness, blood clots, the need for revision surgery, cardiopulmonary complications, among others, and they were willing to proceed. ? ?PROCEDURE IN DETAIL: The patient was brought into the operating room and, after adequate spinal anesthesia was achieved, a tourniquet was placed on the patient's upper thigh. The patient's knee and leg were cleaned and prepped with alcohol and DuraPrep and draped in the  usual sterile fashion. A "timeout" was performed as per usual protocol. The lower extremity was exsanguinated using an Esmarch, and the tourniquet was inflated to 300 mmHg. An anterior longitudinal incision was made followed by a standard mid vastus approach. The deep fibers of the medial collateral ligament were elevated in a subperiosteal fashion off of the medial flare of the tibia so as to maintain a continuous soft tissue sleeve. The patella was subluxed laterally and the patellofemoral ligament was incised. Inspection of the knee demonstrated severe degenerative changes with full-thickness loss of articular cartilage. Osteophytes were debrided using a rongeur. Anterior and posterior cruciate ligaments were excised. Two 4.0 mm Schanz pins were inserted in the femur and into the tibia for attachment of the array of trackers used for computer-assisted navigation. Hip center was identified using a circumduction technique. Distal landmarks were mapped using the computer. The distal femur and proximal tibia were mapped using the computer. The distal femoral cutting guide was positioned using computer-assisted navigation so as to achieve a 5? distal valgus cut. The femur was sized and it was felt that a size 4 femoral component was appropriate. A size 4 femoral cutting guide was positioned and the anterior cut was performed and verified using the computer. This was followed by completion of the posterior and chamfer cuts. Femoral cutting guide for the central box was then positioned in the center box cut was performed. ? ?Attention was then directed to the proximal tibia. Medial and lateral menisci were excised. The extramedullary tibial cutting guide was positioned using computer-assisted navigation so as to achieve a 0? varus-valgus alignment and 3? posterior slope. The cut was performed and verified using the computer. The proximal tibia was sized and it was felt that a size 4 tibial tray was appropriate. Tibial  and femoral  trials were inserted followed by insertion of a 5 mm polyethylene insert. The knee was felt to be tight laterally.  The trial components were removed and the knee was brought into full extension and distracted using the [place] retractors.  The posterolateral corner was carefully released using a combination of electrocautery and Metzenbaum scissors.  Trial components were reinserted followed by placement of a 7 mm polyethylene trial.  This allowed for excellent mediolateral soft tissue balancing both in flexion and in full extension. Finally, the patella was cut and prepared so as to accommodate a 38 mm medialized dome patella. A patella trial was placed and the knee was placed through a range of motion with excellent patellar tracking appreciated. The femoral trial was removed after debridement of posterior osteophytes. The central post-hole for the tibial component was reamed followed by insertion of a keel punch. Tibial trials were then removed. Cut surfaces of bone were irrigated with copious amounts of normal saline using pulsatile lavage and then suctioned dry. Polymethylmethacrylate cement with gentamicin was prepared in the usual fashion using a vacuum mixer. Cement was applied to the cut surface of the proximal tibia as well as along the undersurface of a size 4 rotating platform tibial component. Tibial component was positioned and impacted into place. Excess cement was removed using [employer] fast streamer. Cement was then applied to the cut surfaces of the femur as well as along the posterior flanges of the size 4 femoral component. The femoral component was positioned and impacted into place. Excess cement was removed using [employer] fast streamer. A 7 mm polyethylene trial was inserted and the knee was brought into full extension with steady axial compression applied. Finally, cement was applied to the backside of a 38 mm medialized dome patella and the patellar component was positioned and patellar  clamp applied. Excess cement was removed using [employer] fast streamer. After adequate curing of the cement, the tourniquet was deflated after a total tourniquet time of 102 minutes. Hemostasis was achieved using electrocautery. The knee was irrigated with copious amounts of normal saline using pulsatile lavage followed by 350 ml of Prontosan and then suctioned dry. 20 mL of 1.3% Exparel and 60 mL of 0.25% Marcaine in 40 mL of normal saline was injected along the posterior capsule, medial and lateral gutters, and along the arthrotomy site. A 7 mm stabilized rotating platform polyethylene insert was inserted and the knee was placed through a range of motion with excellent mediolateral soft tissue balancing appreciated and excellent patellar tracking noted. 2 medium drains were placed in the wound bed and brought out through separate stab incisions. The medial parapatellar portion of the incision was reapproximated using interrupted sutures of #1 Vicryl. Subcutaneous tissue was approximated in layers using first #0 Vicryl followed #2-0 Vicryl. The skin was approximated with skin staples. A sterile dressing was applied. ? ?The patient tolerated the procedure well and was transported to the recovery room in stable condition.   ? ?Deantre Bourdon P. Holley Bouche., M.D.  ?

## 2022-02-13 NOTE — Transfer of Care (Signed)
Immediate Anesthesia Transfer of Care Note ? ?Patient: Wanda Lynch ? ?Procedure(s) Performed: COMPUTER ASSISTED TOTAL KNEE ARTHROPLASTY (Right: Knee) ? ?Patient Location: PACU ? ?Anesthesia Type:Spinal ? ?Level of Consciousness: awake, alert  and oriented ? ?Airway & Oxygen Therapy: Patient Spontanous Breathing and Patient connected to face mask oxygen ? ?Post-op Assessment: Report given to RN and Post -op Vital signs reviewed and stable ? ?Post vital signs: Reviewed and stable ? ?Last Vitals:  ?Vitals Value Taken Time  ?BP 145/82 02/13/22 1715  ?Temp 36.2 ?C 02/13/22 1714  ?Pulse 90 02/13/22 1718  ?Resp 13 02/13/22 1718  ?SpO2 97 % 02/13/22 1718  ?Vitals shown include unvalidated device data. ? ?Last Pain:  ?Vitals:  ? 02/13/22 1102  ?TempSrc: Temporal  ?PainSc: 0-No pain  ?   ? ?  ? ?Complications: No notable events documented. ?

## 2022-02-13 NOTE — Anesthesia Procedure Notes (Signed)
Spinal ? ?Patient location during procedure: OR ?Start time: 02/13/2022 1:38 PM ?End time: 02/13/2022 1:42 PM ?Reason for block: surgical anesthesia ?Staffing ?Performed: resident/CRNA  ?Anesthesiologist: Martha Clan, MD ?Resident/CRNA: Jerrye Noble, CRNA ?Preanesthetic Checklist ?Completed: patient identified, IV checked, site marked, risks and benefits discussed, surgical consent, monitors and equipment checked, pre-op evaluation and timeout performed ?Spinal Block ?Patient position: sitting ?Prep: ChloraPrep ?Patient monitoring: heart rate, continuous pulse ox, blood pressure and cardiac monitor ?Approach: midline ?Location: L3-4 ?Injection technique: single-shot ?Needle ?Needle type: Introducer and Pencan  ?Needle gauge: 24 G ?Needle length: 9 cm ?Assessment ?Sensory level: T10 ?Events: CSF return ?Additional Notes ?Sterile aseptic technique used throughout the procedure.  Negative paresthesia. Negative blood return. Positive free-flowing CSF. Expiration date of kit checked and confirmed. Patient tolerated procedure well, without complications. ? ? ? ? ? ?

## 2022-02-13 NOTE — Anesthesia Preprocedure Evaluation (Signed)
Anesthesia Evaluation  ?Patient identified by MRN, date of birth, ID band ?Patient awake ? ? ? ?Reviewed: ?Allergy & Precautions, H&P , NPO status , Patient's Chart, lab work & pertinent test results, reviewed documented beta blocker date and time  ? ?History of Anesthesia Complications ?Negative for: history of anesthetic complications ? ?Airway ?Mallampati: II ? ?TM Distance: >3 FB ?Neck ROM: full ? ? ? Dental ? ?(+) Implants, Dental Advidsory Given, Teeth Intact, Missing ?  ?Pulmonary ?neg pulmonary ROS,  ?  ?Pulmonary exam normal ?breath sounds clear to auscultation ? ? ? ? ? ? Cardiovascular ?Exercise Tolerance: Good ?hypertension, (-) angina(-) Past MI and (-) Cardiac Stents Normal cardiovascular exam(-) dysrhythmias (-) Valvular Problems/Murmurs ?Rhythm:regular Rate:Normal ? ? ?  ?Neuro/Psych ?negative neurological ROS ? negative psych ROS  ? GI/Hepatic ?negative GI ROS, Neg liver ROS,   ?Endo/Other  ?negative endocrine ROS ? Renal/GU ?negative Renal ROS  ?negative genitourinary ?  ?Musculoskeletal ? ? Abdominal ?  ?Peds ? Hematology ?negative hematology ROS ?(+)   ?Anesthesia Other Findings ?Past Medical History: ?No date: Arthritis ?No date: CTS (carpal tunnel syndrome) ?No date: HLD (hyperlipidemia) ?No date: HTN (hypertension) ?No date: Idiopathic peripheral neuropathy ? ? Reproductive/Obstetrics ?negative OB ROS ? ?  ? ? ? ? ? ? ? ? ? ? ? ? ? ?  ?  ? ? ? ? ? ? ? ? ?Anesthesia Physical ?Anesthesia Plan ? ?ASA: 2 ? ?Anesthesia Plan: Spinal  ? ?Post-op Pain Management:   ? ?Induction:  ? ?PONV Risk Score and Plan: Propofol infusion and TIVA ? ?Airway Management Planned: Natural Airway and Simple Face Mask ? ?Additional Equipment:  ? ?Intra-op Plan:  ? ?Post-operative Plan:  ? ?Informed Consent: I have reviewed the patients History and Physical, chart, labs and discussed the procedure including the risks, benefits and alternatives for the proposed anesthesia with the patient  or authorized representative who has indicated his/her understanding and acceptance.  ? ? ? ?Dental Advisory Given ? ?Plan Discussed with: Anesthesiologist, CRNA and Surgeon ? ?Anesthesia Plan Comments:   ? ? ? ? ? ? ?Anesthesia Quick Evaluation ? ?

## 2022-02-13 NOTE — Anesthesia Procedure Notes (Signed)
Procedure Name: [place] ?Date/Time: 02/13/2022 1:45 PM ?Performed by: Jerrye Noble, CRNA ?Pre-anesthesia Checklist: Patient identified, Emergency Drugs available, Suction available and Patient being monitored ?Patient Re-evaluated:Patient Re-evaluated prior to induction ?Oxygen Delivery Method: Simple face mask ? ? ? ? ?

## 2022-02-13 NOTE — H&P (Signed)
The patient has been re-examined, and the chart reviewed, and there have been no interval changes to the documented history and physical.    The risks, benefits, and alternatives have been discussed at length. The patient expressed understanding of the risks benefits and agreed with plans for surgical intervention.  Jacobs Golab P. Genessa Beman, Jr. M.D.    

## 2022-02-14 DIAGNOSIS — M1711 Unilateral primary osteoarthritis, right knee: Secondary | ICD-10-CM | POA: Diagnosis not present

## 2022-02-14 NOTE — Evaluation (Signed)
Occupational Therapy Evaluation ?Patient Details ?Name: Wanda Lynch ?MRN: [id number] ?DOB: 07-12-1956 ?Today's Date: 02/14/2022 ? ? ?History of Present Illness 66 y/o female s/p R TKA 02/13/22.  ? ?Clinical Impression ?  ?Pt seen for OT Tx this date in setting of POD 1 s/p elective R TKA. She presents this date with some expected R knee limitations, but is otherwise in good spirits and agreeable to session. She reports being INDEP at baseline. She lives alone, but her daughter will be assisting upon initial d/c. Pt is able to CTS with SUPV to MOD I with RW consistently and demos good control. She currently requires MIN A with LB ADLs d/t some lingering pain/limited R knee flexion, but demonstrates good tolerance and potential for LB ADL independence with healing and pain control. OT ed with pt and dtr re: polar care, compression stockings and LB ADL modification. Pt left with all needs met and in reach while seated in recliner end of session. Will continue to follow acutely and recommend [place] f/u. ?   ? ?Recommendations for follow up therapy are one component of a multi-disciplinary discharge planning process, led by the attending physician.  Recommendations may be updated based on patient status, additional functional criteria and insurance authorization.  ? ?Follow Up Recommendations ? Home health OT  ?  ?Assistance Recommended at Discharge Set up Supervision/Assistance  ?Patient can return home with the following A little help with bathing/dressing/bathroom;Assistance with cooking/housework;Assist for transportation;Help with stairs or ramp for entrance ? ?  ?Functional Status Assessment ? Patient has had a recent decline in their functional status and demonstrates the ability to make significant improvements in function in a reasonable and predictable amount of time.  ?Equipment Recommendations ? BSC/3in1;Tub/shower seat;Other (comment) (2ww)  ?  ?Recommendations for Other Services   ? ? ?  ?Precautions /  Restrictions Precautions ?Precautions: Fall ?Restrictions ?Weight Bearing Restrictions: Yes ?RLE Weight Bearing: Weight bearing as tolerated  ? ?  ? ?Mobility Bed Mobility ?  ?  ?  ?  ?  ?  ?  ?General bed mobility comments: up to chair pre/post ?  ? ?Transfers ?Overall transfer level: Modified independent ?Equipment used: Rolling walker (2 wheels) ?  ?  ?  ?  ?  ?  ?  ?General transfer comment: SUPV intiial trial for RW seuqnece cues, then demos good carryover on subsequent trials ?  ? ?  ?Balance Overall balance assessment: Modified Independent ?  ?  ?  ?  ?  ?  ?  ?  ?  ?  ?  ?  ?  ?  ?  ?  ?  ?  ?   ? ?ADL either performed or assessed with clinical judgement  ? ?ADL   ?  ?  ?  ?  ?  ?  ?  ?  ?  ?  ?  ?  ?  ?  ?  ?  ?  ?  ?  ?General ADL Comments: SETUP UB ADLs, MIN A for LB ADLs, SUPV for ADL transfers with RW, MAX A for compression stockings, MIN A for polar care. Pt's daughter educated re: mgt.  ? ? ? ?Vision Patient Visual Report: No change from baseline ?   ?   ?Perception   ?  ?Praxis   ?  ? ?Pertinent Vitals/Pain Pain Assessment ?Pain Assessment: 0-10 ?Pain Score: 2  ?Pain Location: R LE ?Pain Descriptors / Indicators: Grimacing ?Pain Intervention(s): Limited activity within patient's tolerance, Monitored during session,  Repositioned, Ice applied  ? ? ? ?Hand Dominance   ?  ?Extremity/Trunk Assessment Upper Extremity Assessment ?Upper Extremity Assessment: Overall WFL for tasks assessed ?  ?Lower Extremity Assessment ?Lower Extremity Assessment: Overall WFL for tasks assessed (expected post op knee flexion limitations impacting LB ADLs to R LE) ?  ?  ?  ?Communication Communication ?Communication: No difficulties ?  ?Cognition Arousal/Alertness: Awake/alert ?Behavior During Therapy: [facility] for tasks assessed/performed ?Overall Cognitive Status: Within Functional Limits for tasks assessed ?  ?  ?  ?  ?  ?  ?  ?  ?  ?  ?  ?  ?  ?  ?  ?  ?  ?  ?  ?General Comments  did experience some dizziness on first CTS with  RW, ed re: BP monitoring, fall prevention ? ?  ?Exercises Other Exercises ?Other Exercises: OT ed re: role, polar care, compression stocking mgt, LB ADL modification ?  ?Shoulder Instructions    ? ? ?Home Living Family/patient expects to be discharged to:: Private residence ?Living Arrangements: Alone ?Available Help at Discharge: Family;Available 24 hours/day (daughter will be staying with her initially) ?  ?Home Access: Stairs to enter ?Entrance Stairs-Number of Steps: 4 ?Entrance Stairs-Rails: Right;Left (wide) ?Home Layout: Able to live on main level with bedroom/bathroom ?  ?  ?  ?  ?  ?  ?  ?Home Equipment: Conservation officer, nature (2 wheels) ?  ?  ?  ? ?  ?Prior Functioning/Environment Prior Level of Function : Independent/Modified Independent ?  ?  ?  ?  ?  ?  ?Mobility Comments: Able to run errands, etc ?  ?  ? ?  ?  ?OT Problem List: Decreased strength;Decreased activity tolerance;Pain;Impaired balance (sitting and/or standing);Decreased knowledge of use of DME or AE ?  ?   ?OT Treatment/Interventions: Self-care/ADL training;Therapeutic exercise;Therapeutic activities;DME and/or AE instruction  ?  ?OT Goals(Current goals can be found in the care plan section) Acute Rehab OT Goals ?Patient Stated Goal: go home ?OT Goal Formulation: With patient/family ?Time For Goal Achievement: 02/28/22 ?Potential to Achieve Goals: Good ?ADL Goals ?Pt Will Perform Lower Body Dressing: with supervision;with adaptive equipment;sit to/from stand ?Pt Will Transfer to Toilet: with supervision;ambulating;grab bars  ?OT Frequency: Min 2X/week ?  ? ?Co-evaluation   ?  ?  ?  ?  ? ?  ?AM-PAC OT "6 Clicks" Daily Activity     ?Outcome Measure Help from another person eating meals?: None ?Help from another person taking care of personal grooming?: A Little ?Help from another person toileting, which includes using toliet, bedpan, or urinal?: A Little ?Help from another person bathing (including washing, rinsing, drying)?: A Little ?Help from  another person to put on and taking off regular upper body clothing?: None ?Help from another person to put on and taking off regular lower body clothing?: A Little ?6 Click Score: 20 ?  ?End of Session Equipment Utilized During Treatment: Gait belt;Rolling walker (2 wheels) ?Nurse Communication: Mobility status ? ?Activity Tolerance: Patient tolerated treatment well ?Patient left: in chair;with call bell/phone within reach;with chair alarm set;with family/visitor present ? ?OT Visit Diagnosis: Unsteadiness on feet (R26.81);Muscle weakness (generalized) (M62.81);Pain ?Pain - Right/Left: Right ?Pain - part of body: Knee  ?              ?Time: 1250-1314 ?OT Time Calculation (min): 24 min ?Charges:  OT General Charges ?$OT Visit: 1 Visit ?OT Evaluation ?$OT Eval Low Complexity: 1 Low ?OT Treatments ?$Self Care/Home Management : 8-22 mins ? ?  Gerrianne Scale, [place], OTR/L ?ascom [phone] ?02/14/22, 5:26 PM  ?

## 2022-02-14 NOTE — Evaluation (Signed)
Physical Therapy Evaluation ?Patient Details ?Name: Wanda Lynch ?MRN: [id number] ?DOB: 12-17-1955 ?Today's Date: 02/14/2022 ? ?History of Present Illness ? 66 y/o female s/p R TKA 02/13/22.  ?Clinical Impression ? Pt did well with PT exam and subsequent exercises and gait training.  She initially reported significant pain ("7 or 8/10"), though she continued to reports this relatively high pain level she did not show any particular self limting behavior.  She was slow to fully engage with against gravity exercises but ultimately made steady improvement with this, was able to do all mobility w/o assist and circumambulated the nurses' station with walker.  She did have some issues with PROM knee flexion but with multiple reps she did show improvement to the high 70s.   ?   ? ?Recommendations for follow up therapy are one component of a multi-disciplinary discharge planning process, led by the attending physician.  Recommendations may be updated based on patient status, additional functional criteria and insurance authorization. ? ?Follow Up Recommendations Follow physician's recommendations for discharge plan and follow up therapies ? ?  ?Assistance Recommended at Discharge Set up Supervision/Assistance  ?Patient can return home with the following ? Assist for transportation;Assistance with cooking/housework ? ?  ?Equipment Recommendations BSC/3in1 (has older FWW, but reports it'll work)  ?Recommendations for Other Services ?    ?  ?Functional Status Assessment Patient has had a recent decline in their functional status and demonstrates the ability to make significant improvements in function in a reasonable and predictable amount of time.  ? ?  ?Precautions / Restrictions Precautions ?Precautions: Fall ?Restrictions ?Weight Bearing Restrictions: Yes ?RLE Weight Bearing: Weight bearing as tolerated  ? ?  ? ?Mobility ? Bed Mobility ?Overal bed mobility: Needs Assistance ?Bed Mobility: Supine to Sit ?  ?  ?  ?  ?  ?General  bed mobility comments: Pt wasable to get to EOB w/o assist, minimal UE reliance on the rails ?  ? ?Transfers ?Overall transfer level: Modified independent ?Equipment used: Rolling walker (2 wheels) ?  ?  ?  ?  ?  ?  ?  ?General transfer comment: Pt did relatively well with rising to standing, needed cuing for set up and sequencing but was able to rise w/o direct assist.  Safely returned to sitting w/o physical assist.. ?  ? ?Ambulation/Gait ?Ambulation/Gait assistance: Min guard ?Gait Distance (Feet): 200 Feet ?Assistive device: Rolling walker (2 wheels) ?  ?  ?  ?  ?General Gait Details: Pt initially with some (expected) hesitation on R, clearly needing walker to unweight some but ultimately with increased distance and cuing she was able to decrease UE reliance and shosedc ability to maintain a more consistent and confident cadence with increased distance. ? ?Stairs ?  ?  ?  ?  ?  ? ?Wheelchair Mobility ?  ? ?Modified Rankin (Stroke Patients Only) ?  ? ?  ? ?Balance Overall balance assessment: Modified Independent ?  ?  ?  ?  ?  ?  ?  ?  ?  ?  ?  ?  ?  ?  ?  ?  ?  ?  ?   ? ? ? ?Pertinent Vitals/Pain Pain Assessment ?Pain Assessment: 0-10 ?Pain Score: 7   ? ? ?Home Living Family/patient expects to be discharged to:: Private residence ?Living Arrangements: Alone ?Available Help at Discharge: Family;Available 24 hours/day (daughter will be staying with her initially) ?  ?Home Access: Stairs to enter ?Entrance Stairs-Rails: Right;Left (too wide for b/l use) ?Entrance  Stairs-Number of Steps: 4 ?  ?Home Layout: Able to live on main level with bedroom/bathroom ?Home Equipment: Agricultural consultant (2 wheels) ?   ?  ?Prior Function Prior Level of Function : Independent/Modified Independent ?  ?  ?  ?  ?  ?  ?Mobility Comments: Able to run errands, etc ?  ?  ? ? ?Hand Dominance  ?   ? ?  ?Extremity/Trunk Assessment  ? Upper Extremity Assessment ?Upper Extremity Assessment: Overall WFL for tasks assessed ?  ? ?Lower Extremity  Assessment ?Lower Extremity Assessment: Overall WFL for tasks assessed (expected post-op limitations) ?  ? ?   ?Communication  ? Communication: No difficulties  ?Cognition Arousal/Alertness: Awake/alert ?Behavior During Therapy: [facility] for tasks assessed/performed ?Overall Cognitive Status: Within Functional Limits for tasks assessed ?  ?  ?  ?  ?  ?  ?  ?  ?  ?  ?  ?  ?  ?  ?  ?  ?  ?  ?  ? ?  ?General Comments   ? ?  ?Exercises Total Joint Exercises ?Ankle Circles/Pumps: AROM, 10 reps ?Quad Sets: Strengthening, 10 reps ?Short Arc Quad: Strengthening, 10 reps ?Heel Slides: AROM, 10 reps (with resisted leg ext) ?Hip ABduction/ADduction: Strengthening, 10 reps ?Straight Leg Raises: AROM, AAROM, 10 reps (light assist on first 5, able to perform w/o assist after warming up reps) ?Knee Flexion: PROM, 5 reps ?Goniometric ROM: 0-77  ? ?Assessment/Plan  ?  ?PT Assessment Patient needs continued PT services  ?PT Problem List Decreased strength;Decreased range of motion;Decreased activity tolerance;Decreased balance;Decreased mobility;Decreased knowledge of use of DME;Decreased safety awareness;Pain ? ?   ?  ?PT Treatment Interventions Gait training;Functional mobility training;Therapeutic exercise;Therapeutic activities;Balance training;Neuromuscular re-education;Patient/family education;DME instruction;Stair training   ? ?PT Goals (Current goals can be found in the Care Plan section)  ?Acute Rehab PT Goals ?PT Goal Formulation: With patient ?Time For Goal Achievement: 02/28/22 ?Potential to Achieve Goals: Good ? ?  ?Frequency BID ?  ? ? ?Co-evaluation   ?  ?  ?  ?  ? ? ?  ?AM-PAC PT "6 Clicks" Mobility  ?Outcome Measure Help needed turning from your back to your side while in a flat bed without using bedrails?: None ?Help needed moving from lying on your back to sitting on the side of a flat bed without using bedrails?: None ?Help needed moving to and from a bed to a chair (including a wheelchair)?: None ?Help needed standing  up from a chair using your arms (e.g., wheelchair or bedside chair)?: None ?Help needed to walk in hospital room?: A Little ?Help needed climbing 3-5 steps with a railing? : A Little ?6 Click Score: 22 ? ?  ?End of Session Equipment Utilized During Treatment: Gait belt ?Activity Tolerance: Patient tolerated treatment well ?Patient left: with chair alarm set;with call bell/phone within reach ?Nurse Communication: Mobility status ?PT Visit Diagnosis: Muscle weakness (generalized) (M62.81);Difficulty in walking, not elsewhere classified (R26.2);Pain ?Pain - Right/Left: Right ?Pain - part of body: Knee ?  ? ?Time: 8527-7824 ?PT Time Calculation (min) (ACUTE ONLY): 40 min ? ? ?Charges:   PT Evaluation ?$PT Eval Low Complexity: 1 Low ?PT Treatments ?$Gait Training: 8-22 mins ?$Therapeutic Exercise: 8-22 mins ?  ?   ? ? ?Malachi Pro, DPT ?02/14/2022, 10:58 AM ? ?

## 2022-02-14 NOTE — Progress Notes (Signed)
Physical Therapy Treatment ?Patient Details ?Name: Wanda Lynch ?MRN: [id number] ?DOB: 10-27-1956 ?Today's Date: 02/14/2022 ? ? ?History of Present Illness 66 y/o female s/p R TKA 02/13/22. ? ?  ?PT Comments  ? ? Pt continues to show great effort and was having much less pain this afternoon.  She was able to easily ambulate > 100 ft with good cadence and confidence as well as negotiate up/down 4 steps without assist using single rail side step strategy.  Pt also showing increased strength and ROM with exercise this afternoon, she'll be able to transition home with assist from family when medically cleared for d/c.   ?Recommendations for follow up therapy are one component of a multi-disciplinary discharge planning process, led by the attending physician.  Recommendations may be updated based on patient status, additional functional criteria and insurance authorization. ? ?Follow Up Recommendations ? Follow physician's recommendations for discharge plan and follow up therapies ?  ?  ?Assistance Recommended at Discharge Set up Supervision/Assistance  ?Patient can return home with the following Assist for transportation;Assistance with cooking/housework ?  ?Equipment Recommendations ? BSC/3in1  ?  ?Recommendations for Other Services   ? ? ?  ?Precautions / Restrictions Precautions ?Precautions: Fall ?Restrictions ?RLE Weight Bearing: Weight bearing as tolerated  ?  ? ?Mobility ? Bed Mobility ?Overal bed mobility: Needs Assistance ?Bed Mobility: Supine to Sit, Sit to Supine ?  ?  ?  ?  ?  ?General bed mobility comments: Pt was able to get to EOB w/o assist, minimal UE reliance on the rails.  A little more difficulty getting LEs up into bed, but again able w/o phyiscal assist. ?  ? ?Transfers ?Overall transfer level: Modified independent ?Equipment used: Rolling walker (2 wheels) ?  ?  ?  ?  ?  ?  ?  ?General transfer comment: Able to rise to standing w/o assist X 4 this session from multiple surfaces and with good safetly  and confidence ?  ? ?Ambulation/Gait ?Ambulation/Gait assistance: Min guard ?Gait Distance (Feet): 125 Feet ?Assistive device: Rolling walker (2 wheels) ?  ?  ?  ?  ?General Gait Details: Pt able to assume consistent cadence quickly with minimal overt reliance on walker/UEs ? ? ?Stairs ?Stairs: Yes ?Stairs assistance: Supervision ?Stair Management: One rail Right, Sideways ?Number of Stairs: 4 ?General stair comments: With minimal cuing to introduce appropraite strategy pt was able to ascend and descend steps without assist and with good safety/confidence ? ? ?Wheelchair Mobility ?  ? ?Modified Rankin (Stroke Patients Only) ?  ? ? ?  ?Balance Overall balance assessment: Modified Independent ?  ?  ?  ?  ?  ?  ?  ?  ?  ?  ?  ?  ?  ?  ?  ?  ?  ?  ?  ? ?  ?Cognition Arousal/Alertness: Awake/alert ?Behavior During Therapy: [facility] for tasks assessed/performed ?Overall Cognitive Status: Within Functional Limits for tasks assessed ?  ?  ?  ?  ?  ?  ?  ?  ?  ?  ?  ?  ?  ?  ?  ?  ?  ?  ?  ? ?  ?Exercises Total Joint Exercises ?Ankle Circles/Pumps: AROM, 10 reps ?Quad Sets: Strengthening, 10 reps ?Short Arc Quad: Strengthening, 15 reps ?Heel Slides: AROM, 10 reps ?Hip ABduction/ADduction: Strengthening, 10 reps ?Straight Leg Raises: AROM, 10 reps ?Knee Flexion: PROM, 5 reps ?Goniometric ROM: 0-77 ? ?  ?General Comments   ?  ?  ? ?  Pertinent Vitals/Pain Pain Assessment ?Pain Assessment: 0-10 ?Pain Score: 2   ? ? ?Home Living   ?  ?  ?  ?  ?  ?  ?  ?  ?  ?   ?  ?Prior Function    ?  ?  ?   ? ?PT Goals (current goals can now be found in the care plan section) Progress towards PT goals: Progressing toward goals ? ?  ?Frequency ? ? ? BID ? ? ? ?  ?PT Plan Current plan remains appropriate  ? ? ?Co-evaluation   ?  ?  ?  ?  ? ?  ?AM-PAC PT "6 Clicks" Mobility   ?Outcome Measure ? Help needed turning from your back to your side while in a flat bed without using bedrails?: None ?Help needed moving from lying on your back to sitting on the  side of a flat bed without using bedrails?: None ?Help needed moving to and from a bed to a chair (including a wheelchair)?: None ?Help needed standing up from a chair using your arms (e.g., wheelchair or bedside chair)?: None ?Help needed to walk in hospital room?: A Little ?Help needed climbing 3-5 steps with a railing? : A Little ?6 Click Score: 22 ? ?  ?End of Session Equipment Utilized During Treatment: Gait belt ?Activity Tolerance: Patient tolerated treatment well ?Patient left: with call bell/phone within reach;with bed alarm set ?Nurse Communication: Mobility status ?PT Visit Diagnosis: Muscle weakness (generalized) (M62.81);Difficulty in walking, not elsewhere classified (R26.2);Pain ?Pain - Right/Left: Right ?Pain - part of body: Knee ?  ? ? ?Time: 2542-7062 ?PT Time Calculation (min) (ACUTE ONLY): 33 min ? ?Charges:  $Gait Training: 8-22 mins ?$Therapeutic Exercise: 8-22 mins          ?          ? ?Malachi Pro, DPT ?02/14/2022, 5:12 PM ? ?

## 2022-02-14 NOTE — Progress Notes (Signed)
? ?  Subjective: ?1 Day Post-Op Procedure(s) (LRB): ?COMPUTER ASSISTED TOTAL KNEE ARTHROPLASTY (Right) ?Patient reports pain as mild.   ?Patient is well, and has had no acute complaints or problems ?Denies any CP, SOB, ABD pain. ?We will continue therapy today.  ?Plan is to go Home after hospital stay. ? ?Objective: ?Vital signs in last 24 hours: ?Temp:  [97.1 ?F (36.2 ?C)-98.1 ?F (36.7 ?C)] 97.9 ?F (36.6 ?C) (03/04 [id number]) ?Pulse Rate:  [64-93] 64 (03/04 0826) ?Resp:  [10-29] 16 (03/04 0826) ?BP: (123-166)/(67-91) 131/67 (03/04 0826) ?SpO2:  [92 %-98 %] 97 % (03/04 0826) ?Weight:  [84.3 kg] 84.3 kg (03/03 1102) ? ?Intake/Output from previous day: ?03/03 0701 - 03/04 0700 ?In: 1000 [I.V.:800; IV Piggyback:200] ?Out: 1160 [Urine:1000; Drains:110; Blood:50] ?Intake/Output this shift: ?No intake/output data recorded. ? ?No results for input(s): HGB in the last 72 hours. ?No results for input(s): WBC, RBC, HCT, PLT in the last 72 hours. ?No results for input(s): NA, K, CL, CO2, BUN, CREATININE, GLUCOSE, CALCIUM in the last 72 hours. ?No results for input(s): LABPT, INR in the last 72 hours. ? ?EXAM ?General - Patient is Alert, Appropriate, and Oriented ?Extremity - Neurovascular intact ?Sensation intact distally ?Intact pulses distally ?Dorsiflexion/Plantar flexion intact ?Dressing - dressing C/D/I and no drainage, hemovac intact ?Motor Function - intact, moving foot and toes well on exam.  ? ?Past Medical History:  ?Diagnosis Date  ? Arthritis   ? CTS (carpal tunnel syndrome)   ? HLD (hyperlipidemia)   ? HTN (hypertension)   ? Idiopathic peripheral neuropathy   ? ? ?Assessment/Plan:   ?1 Day Post-Op Procedure(s) (LRB): ?COMPUTER ASSISTED TOTAL KNEE ARTHROPLASTY (Right) ?Principal Problem: ?  Total knee replacement status ? ?Estimated body mass index is 35.13 kg/m? as calculated from the following: ?  Height as of this encounter: 5\' 1"  (1.549 m). ?  Weight as of this encounter: 84.3 kg. ?Advance diet ?Up with therapy ?Work  on [employer] ?Pain controlled ?VSS ?CM to assist with discharge to home with HHPT tomorrow. Patient lives at home alone and will not have assistance until tomorrow. ? ? ? ?DVT Prophylaxis - Lovenox, TED hose, and SCDs ?Weight-Bearing as tolerated to right leg ? ? ?T. Rachelle Hora, PA-C ?[place] ?02/14/2022, 8:27 AM ?  ?

## 2022-02-15 DIAGNOSIS — M1711 Unilateral primary osteoarthritis, right knee: Secondary | ICD-10-CM | POA: Diagnosis not present

## 2022-02-15 MED ORDER — SENNOSIDES-DOCUSATE SODIUM 8.6-50 MG PO TABS: 1.0000 | ORAL_TABLET | Freq: Two times a day (BID) | ORAL | 0 refills | Status: AC

## 2022-02-15 MED ORDER — ENOXAPARIN SODIUM 40 MG/0.4ML IJ SOSY: 40.0000 mg | PREFILLED_SYRINGE | INTRAMUSCULAR | 0 refills | Status: DC

## 2022-02-15 MED ORDER — OXYCODONE HCL 5 MG PO TABS: 5.0000 mg | ORAL_TABLET | ORAL | 0 refills | Status: DC | PRN

## 2022-02-15 MED ORDER — TRAMADOL HCL 50 MG PO TABS: 50.0000 mg | ORAL_TABLET | ORAL | 0 refills | Status: DC | PRN

## 2022-02-15 MED ORDER — CELECOXIB 200 MG PO CAPS: 200.0000 mg | ORAL_CAPSULE | Freq: Two times a day (BID) | ORAL | 0 refills | Status: AC

## 2022-02-15 MED ORDER — ACETAMINOPHEN 325 MG PO TABS: 325.0000 mg | ORAL_TABLET | Freq: Four times a day (QID) | ORAL | Status: DC | PRN

## 2022-02-15 NOTE — Progress Notes (Signed)
Physical Therapy Treatment ?Patient Details ?Name: Wanda Lynch ?MRN: [id number] ?DOB: 28-Oct-1956 ?Today's Date: 02/15/2022 ? ? ?History of Present Illness 66 y/o female s/p R TKA 02/13/22. ? ?  ?PT Comments  ? ? Pt continues to be pleasant and motivated with PT sessions.  She showed great effort and was able to perform exercises with minimal increased pain and generally managing to tolerate light resistance with most acts; ROM improving and nearly to 90* of flexion.  Pt was able to circumambulate the nurses' station and negotiate up/down steps with efficient, safe and confident effort and apart from expected post-op soreness/stiffness has functionally done very well.  Pt safe and appropriate for d/c to home from a PT perspective.    ?Recommendations for follow up therapy are one component of a multi-disciplinary discharge planning process, led by the attending physician.  Recommendations may be updated based on patient status, additional functional criteria and insurance authorization. ? ?Follow Up Recommendations ? Home health PT ?  ?  ?Assistance Recommended at Discharge Set up Supervision/Assistance  ?Patient can return home with the following Assist for transportation;Assistance with cooking/housework ?  ?Equipment Recommendations ? BSC/3in1  ?  ?Recommendations for Other Services   ? ? ?  ?Precautions / Restrictions Precautions ?Precautions: Fall ?Restrictions ?Weight Bearing Restrictions: Yes ?RLE Weight Bearing: Weight bearing as tolerated  ?  ? ?Mobility ? Bed Mobility ?Overal bed mobility: Needs Assistance ?Bed Mobility: Supine to Sit ?  ?  ?Supine to sit: Supervision ?  ?  ?General bed mobility comments: Pt able to get up to sitting EOB w/o assist, no hesitation ?  ? ?Transfers ?Overall transfer level: Modified independent ?Equipment used: Rolling walker (2 wheels) ?  ?  ?  ?  ?  ?  ?  ?General transfer comment: Pt did not need reminders for hand placement and rose to standing and returned to sitting multiple  times t/o session w/o issue ?  ? ?Ambulation/Gait ?Ambulation/Gait assistance: Modified independent (Device/Increase time) ?Gait Distance (Feet): 250 Feet ?Assistive device: Rolling walker (2 wheels) ?  ?  ?  ?  ?General Gait Details: Initial expected hesitation/limp on R with first 40-50 ft, but assumed confident and consistent cadence relatively quickly with minimal need for cuing for cadence, step length, posture, etc.  No LOBs or overt safety issues. ? ? ?Stairs ?Stairs: Yes ?Stairs assistance: Supervision ?Stair Management: One rail Right, Sideways ?Number of Stairs: 4 ?General stair comments: Pt was able to ascend/descend steps confidently and w/o VC/reminders for appropriate strategy.  Pt voices confidence with ability to access home. ? ? ?Wheelchair Mobility ?  ? ?Modified Rankin (Stroke Patients Only) ?  ? ? ?  ?Balance Overall balance assessment: Modified Independent ?  ?  ?  ?  ?  ?  ?  ?  ?  ?  ?  ?  ?  ?  ?  ?  ?  ?  ?  ? ?  ?Cognition Arousal/Alertness: Awake/alert ?Behavior During Therapy: [facility] for tasks assessed/performed ?Overall Cognitive Status: Within Functional Limits for tasks assessed ?  ?  ?  ?  ?  ?  ?  ?  ?  ?  ?  ?  ?  ?  ?  ?  ?  ?  ?  ? ?  ?Exercises Total Joint Exercises ?Ankle Circles/Pumps: AROM, 10 reps ?Quad Sets: Strengthening, 15 reps ?Short Arc Quad: Strengthening, 10 reps ?Heel Slides: Strengthening, 10 reps (with resisted leg ext) ?Hip ABduction/ADduction: Strengthening, 15 reps ?Straight  Leg Raises: AROM, 10 reps ?Knee Flexion: PROM, 5 reps ?Goniometric ROM: 0-87 ? ?  ?General Comments   ?  ?  ? ?Pertinent Vitals/Pain Pain Assessment ?Pain Assessment: 0-10 ?Pain Score: 2  ?Pain Location: R LE  ? ? ?Home Living   ?  ?  ?  ?  ?  ?  ?  ?  ?  ?   ?  ?Prior Function    ?  ?  ?   ? ?PT Goals (current goals can now be found in the care plan section) Progress towards PT goals: Progressing toward goals ? ?  ?Frequency ? ? ? BID ? ? ? ?  ?PT Plan Current plan remains appropriate   ? ? ?Co-evaluation   ?  ?  ?  ?  ? ?  ?AM-PAC PT "6 Clicks" Mobility   ?Outcome Measure ? Help needed turning from your back to your side while in a flat bed without using bedrails?: None ?Help needed moving from lying on your back to sitting on the side of a flat bed without using bedrails?: None ?Help needed moving to and from a bed to a chair (including a wheelchair)?: None ?Help needed standing up from a chair using your arms (e.g., wheelchair or bedside chair)?: None ?Help needed to walk in hospital room?: A Little ?Help needed climbing 3-5 steps with a railing? : A Little ?6 Click Score: 22 ? ?  ?End of Session Equipment Utilized During Treatment: Gait belt ?Activity Tolerance: Patient tolerated treatment well ?Patient left: with call bell/phone within reach;with bed alarm set ?Nurse Communication: Mobility status ?PT Visit Diagnosis: Muscle weakness (generalized) (M62.81);Difficulty in walking, not elsewhere classified (R26.2);Pain ?Pain - Right/Left: Right ?Pain - part of body: Knee ?  ? ? ?Time: [id number] ?PT Time Calculation (min) (ACUTE ONLY): 41 min ? ?Charges:  $Gait Training: 8-22 mins ?$Therapeutic Exercise: 8-22 mins ?$Therapeutic Activity: 8-22 mins          ?          ? ?Kreg Shropshire, DPT ?02/15/2022, 9:54 AM ? ?

## 2022-02-15 NOTE — Progress Notes (Signed)
? ?  Subjective: ?2 Days Post-Op Procedure(s) (LRB): ?COMPUTER ASSISTED TOTAL KNEE ARTHROPLASTY (Right) ?Patient reports pain as mild.   ?Patient is well, and has had no acute complaints or problems ?Denies any CP, SOB, ABD pain. ?We will continue therapy today.  ?Plan is to go Home after hospital stay. ? ?Objective: ?Vital signs in last 24 hours: ?Temp:  [97.6 ?F (36.4 ?C)-98 ?F (36.7 ?C)] 98 ?F (36.7 ?C) (03/05 6073) ?Pulse Rate:  [61-82] 80 (03/05 0814) ?Resp:  [12-18] 16 (03/05 7106) ?BP: (112-164)/(56-79) 159/68 (03/05 2694) ?SpO2:  [95 %-98 %] 97 % (03/05 0814) ? ?Intake/Output from previous day: ?03/04 0701 - 03/05 0700 ?In: 1504.7 [P.O.:240; I.V.:977.4; IV Piggyback:287.4] ?Out: 310 [Drains:310] ?Intake/Output this shift: ?No intake/output data recorded. ? ?No results for input(s): HGB in the last 72 hours. ?No results for input(s): WBC, RBC, HCT, PLT in the last 72 hours. ?No results for input(s): NA, K, CL, CO2, BUN, CREATININE, GLUCOSE, CALCIUM in the last 72 hours. ?No results for input(s): LABPT, INR in the last 72 hours. ? ?EXAM ?General - Patient is Alert, Appropriate, and Oriented ?Extremity - Neurovascular intact ?Sensation intact distally ?Intact pulses distally ?Dorsiflexion/Plantar flexion intact ?Dressing - dressing C/D/I and no drainage, hemovac intact ?Motor Function - intact, moving foot and toes well on exam.  ? ?Past Medical History:  ?Diagnosis Date  ? Arthritis   ? CTS (carpal tunnel syndrome)   ? HLD (hyperlipidemia)   ? HTN (hypertension)   ? Idiopathic peripheral neuropathy   ? ? ?Assessment/Plan:   ?2 Days Post-Op Procedure(s) (LRB): ?COMPUTER ASSISTED TOTAL KNEE ARTHROPLASTY (Right) ?Principal Problem: ?  Total knee replacement status ? ?Estimated body mass index is 35.13 kg/m? as calculated from the following: ?  Height as of this encounter: 5\' 1"  (1.549 m). ?  Weight as of this encounter: 84.3 kg. ?Advance diet ?Up with therapy ?Work on ?Pain controlled ?VSS ?Patient doing well  with PT ?CM to assist with discharge to home with HHPT today ? ? ? ?DVT Prophylaxis - Lovenox, TED hose, and SCDs ?Weight-Bearing as tolerated to right leg ? ? ?T. Beazer Homes, PA-C ?[facility] ?02/15/2022, 9:49 AM ?  ?

## 2022-02-15 NOTE — Discharge Summary (Signed)
?Physician Discharge Summary  ?Patient ID: ?Wanda Lynch ?MRN: [id number] ?DOB/AGE: 67-Jan-1957 67 y.o. ? ?Admit date: 02/13/2022 ?Discharge date: 02/15/2022 ? ?Admission Diagnoses:  ?Total knee replacement status [Z96.659] ? ? ?Discharge Diagnoses: ?Patient Active Problem List  ? Diagnosis Date Noted  ? Total knee replacement status 02/13/2022  ? Exertional dyspnea 02/12/2022  ? Hyperlipidemia, unspecified 02/12/2022  ? Hypertension 02/12/2022  ? ? ?Past Medical History:  ?Diagnosis Date  ? Arthritis   ? CTS (carpal tunnel syndrome)   ? HLD (hyperlipidemia)   ? HTN (hypertension)   ? Idiopathic peripheral neuropathy   ? ?  ?Transfusion: none ?  ?Consultants (if any):  ? ?Discharged Condition: Improved ? ?Hospital Course: Wanda Lynch is an 66 y.o. female who was admitted 02/13/2022 with a diagnosis of Total knee replacement status and went to the operating room on 02/13/2022 and underwent the above named procedures.  ?  ?Surgeries: Procedure(s): ?COMPUTER ASSISTED TOTAL KNEE ARTHROPLASTY on 02/13/2022 ?Patient tolerated the surgery well. Taken to PACU where she was stabilized and then transferred to the orthopedic floor. ? ?Started on Lovenox 30 mg q 12 hrs. SCDs applied. Heels elevated on bed with rolled towels. No evidence of DVT. Negative Homan. ?Physical therapy started on day #1 for gait training and transfer. OT started day #1 for ADL and assisted devices. ? ?Patient's foley was d/c on day #1. Patient ambulated > 175ft and completed stairs on POD1. Patient's IV and hemovac was d/c on day #2. ? ?On post op day #2 patient was stable and ready for discharge to home with HHPT. ? ?Implants: DePuy Attune size 4 posterior stabilized femoral component (cemented), size 4 rotating platform tibial component (cemented), 38 mm medialized dome patella (cemented), and a 7 mm stabilized rotating platform polyethylene insert. ?  ? ?She was given perioperative antibiotics:  ?Anti-infectives (From admission, onward)  ? ? Start      Dose/Rate Route Frequency Ordered Stop  ? 02/13/22 2000  ceFAZolin (ANCEF) IVPB 2g/100 mL premix       ? 2 g ?200 mL/hr over 30 Minutes Intravenous Every 6 hours 02/13/22 1849 02/14/22 0751  ? 02/13/22 1053  ceFAZolin (ANCEF) 2-4 GM/100ML-% IVPB       ?Note to Pharmacy: Norton Blizzard A: cabinet override  ?    02/13/22 1053 02/14/22 0751  ? 02/13/22 0600  ceFAZolin (ANCEF) IVPB 2g/100 mL premix       ? 2 g ?200 mL/hr over 30 Minutes Intravenous On call to O.R. 02/13/22 0022 02/13/22 1414  ? ?  ?. ? ?She was given sequential compression devices, early ambulation, and Lovenox TEDs for DVT prophylaxis. ? ?She benefited maximally from the hospital stay and there were no complications.   ? ?Recent vital signs:  ?Vitals:  ? 02/15/22 0419 02/15/22 0814  ?BP: (!) 164/79 (!) 159/68  ?Pulse: 82 80  ?Resp: 12 16  ?Temp: 97.6 ?F (36.4 ?C) 98 ?F (36.7 ?C)  ?SpO2: 98% 97%  ? ? ?Recent laboratory studies:  ?Lab Results  ?Component Value Date  ? HGB 15.4 (H) 02/02/2022  ? ?Lab Results  ?Component Value Date  ? WBC 7.2 02/02/2022  ? PLT 229 02/02/2022  ? ?No results found for: INR ?Lab Results  ?Component Value Date  ? NA 138 02/02/2022  ? K 3.9 02/02/2022  ? CL 103 02/02/2022  ? CO2 27 02/02/2022  ? BUN 20 02/02/2022  ? CREATININE 0.57 02/02/2022  ? GLUCOSE 111 (H) 02/02/2022  ? ? ?Discharge Medications:   ?  Allergies as of 02/15/2022   ? ?   Reactions  ? Mometasone   ? Other reaction(s): Other (See Comments) ?Nose irritation  ? ?  ? ?  ?Medication List  ?  ? ?STOP taking these medications   ? ?acetaminophen 650 MG CR tablet ?Commonly known as: TYLENOL ?Replaced by: acetaminophen 325 MG tablet ?  ?ibuprofen 200 MG tablet ?Commonly known as: ADVIL ?  ?meloxicam 7.5 MG tablet ?Commonly known as: MOBIC ?  ? ?  ? ?TAKE these medications   ? ?acetaminophen 325 MG tablet ?Commonly known as: TYLENOL ?Take 1-2 tablets (325-650 mg total) by mouth every 6 (six) hours as needed for mild pain (pain score 1-3 or temp > 100.5). ?Replaces:  acetaminophen 650 MG CR tablet ?  ?CALCIUM PO ?Take 1 tablet by mouth daily. ?  ?celecoxib 200 MG capsule ?Commonly known as: CELEBREX ?Take 1 capsule (200 mg total) by mouth 2 (two) times daily. ?  ?enoxaparin 40 MG/0.4ML injection ?Commonly known as: LOVENOX ?Inject 0.4 mLs (40 mg total) into the skin daily for 14 days. ?  ?ketotifen 0.025 % ophthalmic solution ?Commonly known as: ZADITOR ?Place 1 drop into both eyes daily. ?  ?methocarbamol 750 MG tablet ?Commonly known as: ROBAXIN ?Take 500 mg by mouth daily as needed (Back pain). ?  ?metoprolol succinate 25 MG 24 hr tablet ?Commonly known as: TOPROL-XL ?Take 25 mg by mouth daily. ?  ?oxybutynin 10 MG 24 hr tablet ?Commonly known as: DITROPAN-XL ?Take 10 mg by mouth daily. ?  ?oxyCODONE 5 MG immediate release tablet ?Commonly known as: Oxy IR/ROXICODONE ?Take 1-2 tablets (5-10 mg total) by mouth every 4 (four) hours as needed for moderate pain (pain score 4-6). ?  ?senna-docusate 8.6-50 MG tablet ?Commonly known as: Senokot-S ?Take 1 tablet by mouth 2 (two) times daily. ?  ?simvastatin 20 MG tablet ?Commonly known as: ZOCOR ?Take 20 mg by mouth at bedtime. ?  ?Systane 0.4-0.3 % Soln ?Generic drug: Polyethyl Glycol-Propyl Glycol ?Place 1 drop into both eyes daily. ?  ?traMADol 50 MG tablet ?Commonly known as: ULTRAM ?Take 1-2 tablets (50-100 mg total) by mouth every 4 (four) hours as needed for moderate pain. ?  ? ?  ? ?  ?  ? ? ?  ?Durable Medical Equipment  ?(From admission, onward)  ?  ? ? ?  ? ?  Start     Ordered  ? 02/13/22 1850  DME Walker rolling  Once       ?Question:  Patient needs a walker to treat with the following condition  Answer:  Total knee replacement status  ? 02/13/22 1849  ? 02/13/22 1850  DME Bedside commode  Once       ?Question:  Patient needs a bedside commode to treat with the following condition  Answer:  Total knee replacement status  ? 02/13/22 1849  ? ?  ?  ? ?  ? ? ?Diagnostic Studies: DG Knee Right Port ? ?Result Date:  02/13/2022 ?CLINICAL DATA:  Status post right knee replacement. EXAM: PORTABLE RIGHT KNEE - 1-2 VIEW COMPARISON:  None. FINDINGS: Right knee arthroplasty in expected alignment. No periprosthetic lucency or fracture. There has been patellar resurfacing. Recent postsurgical change includes air and edema in the soft tissues and joint space. Anterior skin staples in place. Suprapatellar drain. There are ghost tracks in the proximal tibia likely related to prior pin placement. IMPRESSION: Right knee arthroplasty without immediate postoperative complication. Electronically Signed   By: Aurther Loft.D.  On: 02/13/2022 17:38   ? ?Disposition:  ? ? ? ? Follow-up Information   ? ? Tamala Julian B, PA-C Follow up on 02/27/2022.   ?Specialty: Orthopedic Surgery ?Why: at 8:45am ?Contact information: ?[address] ?[facility] ?[place] [address] ?[phone] ? ? ?  ?  ? ? Dereck Leep, MD Follow up on 03/26/2022.   ?Specialty: Orthopedic Surgery ?Why: at 2:15pm ?Contact information: ?[address] ?[place] [address] ?[phone] ? ? ?  ?  ? ?  ?  ? ?  ? ? ? ?Signed: ?Dorise Hiss CHRISTOPHER ?02/15/2022, 9:53 AM ? ? ?  ?

## 2022-02-15 NOTE — Progress Notes (Signed)
Pt given verbal and written discharge instructions and verbalizes understanding. Waiting for son to pick-up. ?

## 2022-02-16 ENCOUNTER — Encounter: Payer: Self-pay | Admitting: Orthopedic Surgery

## 2022-11-30 ENCOUNTER — Other Ambulatory Visit: Payer: Self-pay | Admitting: Family Medicine

## 2022-11-30 DIAGNOSIS — Z1231 Encounter for screening mammogram for malignant neoplasm of breast: Secondary | ICD-10-CM

## 2022-12-28 ENCOUNTER — Ambulatory Visit
Admission: RE | Admit: 2022-12-28 | Discharge: 2022-12-28 | Disposition: A | Discharge status: Home / Self Care | Payer: Medicare Other | Source: Ambulatory Visit | Attending: Family Medicine | Admitting: Family Medicine

## 2022-12-28 DIAGNOSIS — Z1231 Encounter for screening mammogram for malignant neoplasm of breast: Secondary | ICD-10-CM | POA: Insufficient documentation

## 2022-12-30 IMAGING — DX DG KNEE 1-2V PORT*R*
4 series · 4 of 4 positions shown · non-contrast
Comparison: None.

CLINICAL DATA: Status post right knee replacement.

EXAM:
PORTABLE RIGHT KNEE - 1-2 VIEW

[knee ap]
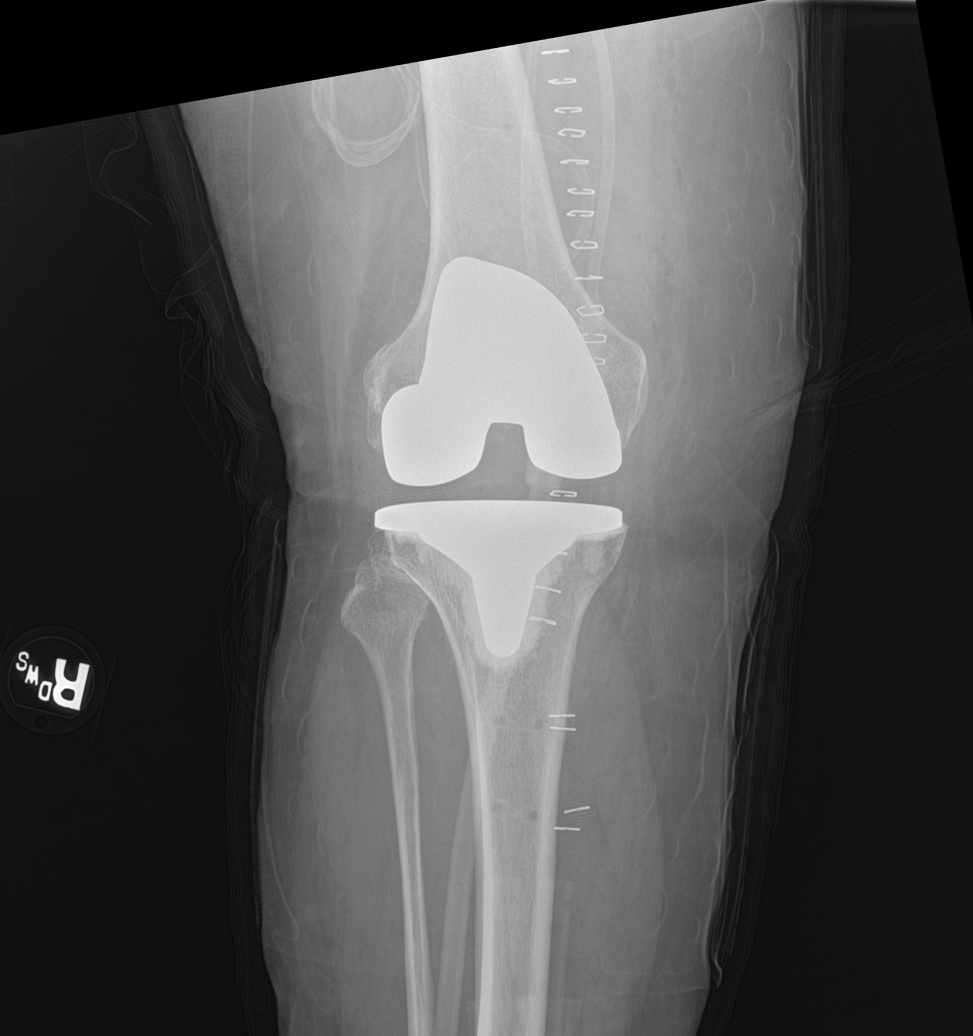

[knee lat]
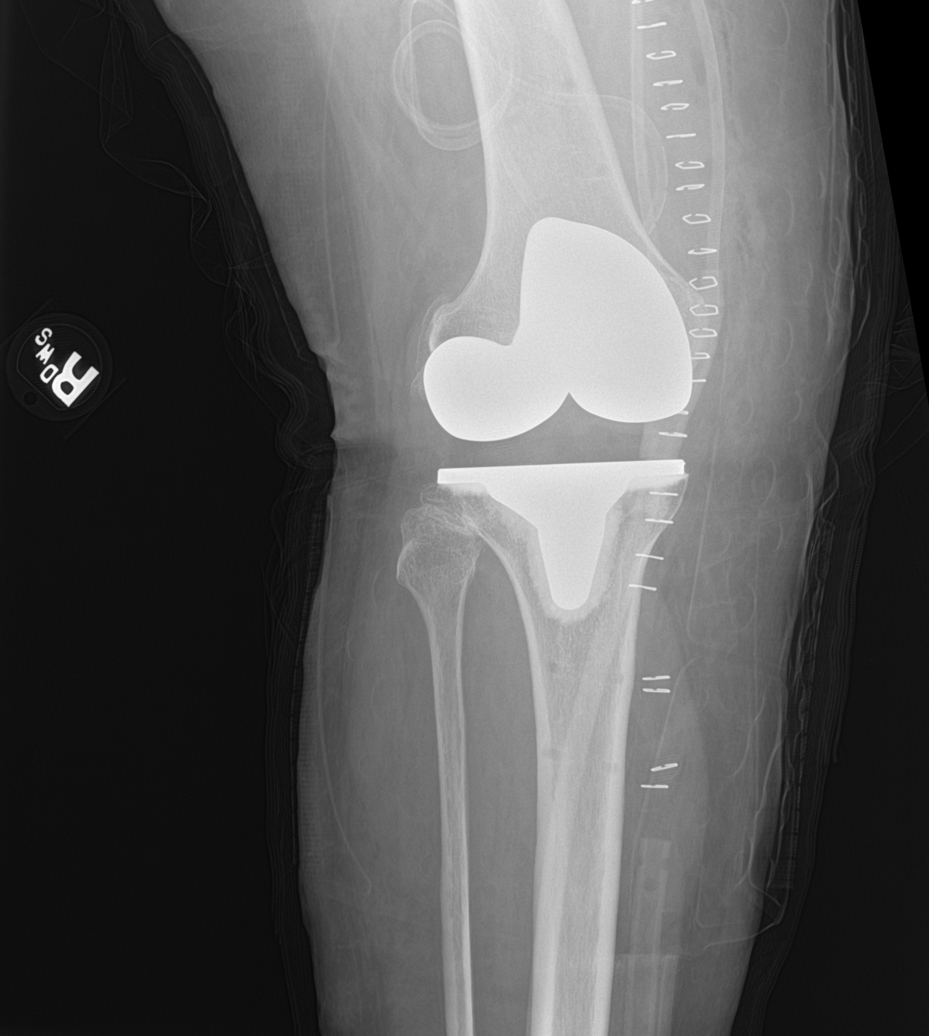

[knee obl (1 of 2)]
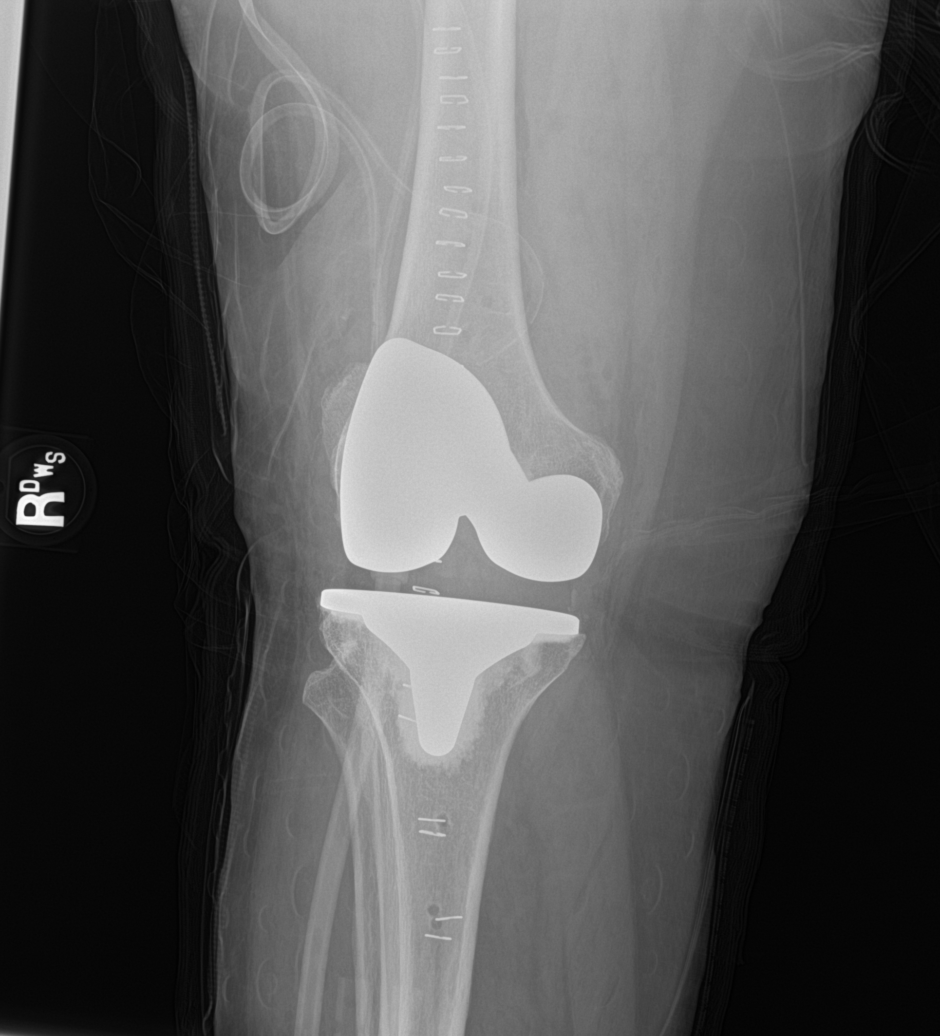

[knee obl (2 of 2)]
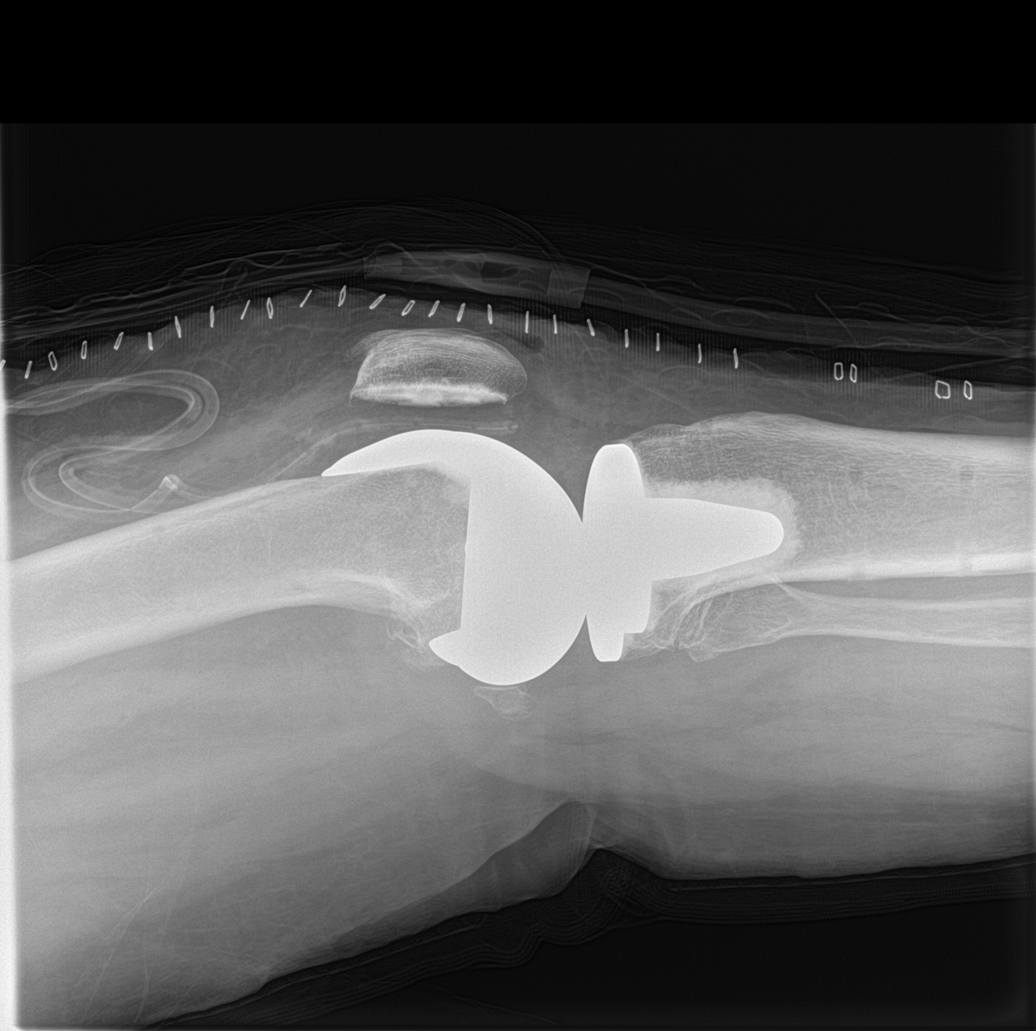

[4 of 4 positions shown; findings below may reference images not displayed]

FINDINGS: Right knee arthroplasty in expected alignment. No periprosthetic
lucency or fracture. There has been patellar resurfacing. Recent
postsurgical change includes air and edema in the soft tissues and
joint space. Anterior skin staples in place. Suprapatellar drain.
There are ghost tracks in the proximal tibia likely related to prior
pin placement.
IMPRESSION: Right knee arthroplasty without immediate postoperative
complication.

## 2023-03-22 ENCOUNTER — Encounter: Payer: Self-pay | Admitting: *Deleted

## 2023-03-23 ENCOUNTER — Ambulatory Visit: Payer: Medicare Other | Admitting: Anesthesiology

## 2023-03-23 ENCOUNTER — Encounter: Payer: Self-pay | Admitting: *Deleted

## 2023-03-23 ENCOUNTER — Ambulatory Visit
Admission: RE | Admit: 2023-03-23 | Discharge: 2023-03-23 | Disposition: A | Discharge status: Home / Self Care | Payer: Medicare Other | Attending: Gastroenterology | Admitting: Gastroenterology

## 2023-03-23 ENCOUNTER — Other Ambulatory Visit: Payer: Self-pay

## 2023-03-23 ENCOUNTER — Encounter
Admission: RE | Disposition: A | Discharge status: Home / Self Care | Payer: Self-pay | Source: Home / Self Care | Attending: Gastroenterology

## 2023-03-23 DIAGNOSIS — I10 Essential (primary) hypertension: Secondary | ICD-10-CM | POA: Insufficient documentation

## 2023-03-23 DIAGNOSIS — Z6837 Body mass index (BMI) 37.0-37.9, adult: Secondary | ICD-10-CM | POA: Insufficient documentation

## 2023-03-23 DIAGNOSIS — E669 Obesity, unspecified: Secondary | ICD-10-CM | POA: Diagnosis not present

## 2023-03-23 DIAGNOSIS — K640 First degree hemorrhoids: Secondary | ICD-10-CM | POA: Insufficient documentation

## 2023-03-23 DIAGNOSIS — M1990 Unspecified osteoarthritis, unspecified site: Secondary | ICD-10-CM | POA: Insufficient documentation

## 2023-03-23 DIAGNOSIS — K5730 Diverticulosis of large intestine without perforation or abscess without bleeding: Secondary | ICD-10-CM | POA: Diagnosis not present

## 2023-03-23 DIAGNOSIS — Z1211 Encounter for screening for malignant neoplasm of colon: Secondary | ICD-10-CM | POA: Diagnosis present

## 2023-03-23 HISTORY — PX: COLONOSCOPY: SHX5424

## 2023-03-23 SURGERY — COLONOSCOPY
Anesthesia: General

## 2023-03-23 MED ORDER — SODIUM CHLORIDE 0.9 % IV SOLN: INTRAVENOUS | Status: DC

## 2023-03-23 MED ORDER — PROPOFOL 500 MG/50ML IV EMUL
INTRAVENOUS | Status: DC | PRN
  Administered 2023-03-23: 165 ug/kg/min via INTRAVENOUS

## 2023-03-23 MED ORDER — LIDOCAINE HCL (CARDIAC) PF 100 MG/5ML IV SOSY
PREFILLED_SYRINGE | INTRAVENOUS | Status: DC | PRN
  Administered 2023-03-23: 100 mg via INTRAVENOUS

## 2023-03-23 MED ORDER — PROPOFOL 10 MG/ML IV BOLUS
INTRAVENOUS | Status: DC | PRN
  Administered 2023-03-23: 70 mg via INTRAVENOUS

## 2023-03-23 NOTE — Anesthesia Postprocedure Evaluation (Signed)
Anesthesia Post Note  Patient: Wanda Lynch  Procedure(s) Performed: COLONOSCOPY  Patient location during evaluation: Endoscopy Anesthesia Type: General Level of consciousness: awake and alert Pain management: pain level controlled Vital Signs Assessment: post-procedure vital signs reviewed and stable Respiratory status: spontaneous breathing, nonlabored ventilation and respiratory function stable Cardiovascular status: blood pressure returned to baseline and stable Postop Assessment: no apparent nausea or vomiting Anesthetic complications: no   No notable events documented.   Last Vitals:  Vitals:   03/23/23 1018 03/23/23 1028  BP: 115/78 128/78  Pulse:    Resp:    Temp:    SpO2: 99% 98%    Last Pain:  Vitals:   03/23/23 1018  TempSrc:   PainSc: 0-No pain                 Foye Deer

## 2023-03-23 NOTE — Op Note (Signed)
[facility] Gastroenterology Patient Name: Wanda Lynch Procedure Date: 03/23/2023 9:43 AM MRN: [id number] Account #: [id number] Date of Birth: Oct 01, 1956 Admit Type: Outpatient Age: 67 Room: [facility] ENDO ROOM 4 Gender: Female Note Status: Finalized Instrument Name: Nelda Marseille [id number] Procedure:             Colonoscopy Indications:           Screening for colorectal malignant neoplasm Providers:             Eather Colas MD, MD Referring MD:          Rhona Leavens. Burnett Sheng, MD (Referring MD) Medicines:             Monitored Anesthesia Care Complications:         No immediate complications. Procedure:             Pre-Anesthesia Assessment:                        - Prior to the procedure, a History and Physical was                         performed, and patient medications and allergies were                         reviewed. The patient is competent. The risks and                         benefits of the procedure and the sedation options and                         risks were discussed with the patient. All questions                         were answered and informed consent was obtained.                         Patient identification and proposed procedure were                         verified by the physician, the nurse, the                         anesthesiologist, the anesthetist and the technician                         in the endoscopy suite. Mental Status Examination:                         alert and oriented. Airway Examination: normal                         oropharyngeal airway and neck mobility. Respiratory                         Examination: clear to auscultation. CV Examination:                         normal. Prophylactic Antibiotics: The patient does not  require prophylactic antibiotics. Prior                         Anticoagulants: The patient has taken no anticoagulant                         or antiplatelet agents. ASA Grade  Assessment: II - A                         patient with mild systemic disease. After reviewing                         the risks and benefits, the patient was deemed in                         satisfactory condition to undergo the procedure. The                         anesthesia plan was to use monitored anesthesia care                         (MAC). Immediately prior to administration of                         medications, the patient was re-assessed for adequacy                         to receive sedatives. The heart rate, respiratory                         rate, oxygen saturations, blood pressure, adequacy of                         pulmonary ventilation, and response to care were                         monitored throughout the procedure. The physical                         status of the patient was re-assessed after the                         procedure.                        After obtaining informed consent, the colonoscope was                         passed under direct vision. Throughout the procedure,                         the patient's blood pressure, pulse, and oxygen                         saturations were monitored continuously. The                         Colonoscope was introduced through the anus and  advanced to the the cecum, identified by appendiceal                         orifice and ileocecal valve. The colonoscopy was                         somewhat difficult due to significant looping.                         Successful completion of the procedure was aided by                         applying abdominal pressure. The patient tolerated the                         procedure well. The quality of the bowel preparation                         was good. The ileocecal valve, appendiceal orifice,                         and rectum were photographed. Findings:      The perianal and digital rectal examinations were normal.      A single  small-mouthed diverticulum was found in the sigmoid colon.      Internal hemorrhoids were found during retroflexion. The hemorrhoids       were Grade I (internal hemorrhoids that do not prolapse).      The exam was otherwise without abnormality on direct and retroflexion       views. Impression:            - Diverticulosis in the sigmoid colon.                        - Internal hemorrhoids.                        - The examination was otherwise normal on direct and                         retroflexion views.                        - No specimens collected. Recommendation:        - Discharge patient to home.                        - Resume previous diet.                        - Continue present medications.                        - Repeat colonoscopy in 10 years for screening                         purposes.                        - Return to referring physician as previously  scheduled. Procedure Code(s):     --- Professional ---                        C1448, Colorectal cancer screening; colonoscopy on                         individual not meeting criteria for high risk Diagnosis Code(s):     --- Professional ---                        Z12.11, Encounter for screening for malignant neoplasm                         of colon                        K64.0, First degree hemorrhoids                        K57.30, Diverticulosis of large intestine without                         perforation or abscess without bleeding CPT copyright 2022 American Medical Association. All rights reserved. The codes documented in this report are preliminary and upon coder review may  be revised to meet current compliance requirements. Eather Colas MD, MD 03/23/2023 10:08:46 AM Number of Addenda: 0 Note Initiated On: 03/23/2023 9:43 AM Scope Withdrawal Time: 0 hours 7 minutes 40 seconds  Total Procedure Duration: 0 hours 17 minutes 5 seconds  Estimated Blood Loss:  Estimated blood  loss: none.      [facility]

## 2023-03-23 NOTE — Interval H&P Note (Signed)
History and Physical Interval Note:  03/23/2023 9:35 AM  Wanda Lynch  has presented today for surgery, with the diagnosis of Colon cancer screening (Z12.11).  The various methods of treatment have been discussed with the patient and family. After consideration of risks, benefits and other options for treatment, the patient has consented to  Procedure(s): COLONOSCOPY (N/A) as a surgical intervention.  The patient's history has been reviewed, patient examined, no change in status, stable for surgery.  I have reviewed the patient's chart and labs.  Questions were answered to the patient's satisfaction.     Regis Bill  Ok to proceed with colonoscopy

## 2023-03-23 NOTE — Anesthesia Procedure Notes (Signed)
Procedure Name: General with mask airway Date/Time: 03/23/2023 9:52 AM  Performed by: Mohammed Kindle, CRNAPre-anesthesia Checklist: Patient identified, Emergency Drugs available, Suction available and Patient being monitored Patient Re-evaluated:Patient Re-evaluated prior to induction Oxygen Delivery Method: Simple face mask Induction Type: IV induction Placement Confirmation: positive ETCO2, CO2 detector and breath sounds checked- equal and bilateral Dental Injury: Teeth and Oropharynx as per pre-operative assessment

## 2023-03-23 NOTE — Transfer of Care (Signed)
Immediate Anesthesia Transfer of Care Note  Patient: Wanda Lynch  Procedure(s) Performed: COLONOSCOPY  Patient Location: Endoscopy Unit  Anesthesia Type:General  Level of Consciousness: drowsy and patient cooperative  Airway & Oxygen Therapy: Patient Spontanous Breathing and Patient connected to face mask oxygen  Post-op Assessment: Report given to RN and Post -op Vital signs reviewed and stable  Post vital signs: Reviewed and stable  Last Vitals:  Vitals Value Taken Time  BP 107/49 03/23/23 1008  Temp 36 C 03/23/23 1008  Pulse 82 03/23/23 1008  Resp 13 03/23/23 1008  SpO2 98 % 03/23/23 1008    Last Pain:  Vitals:   03/23/23 1008  TempSrc: Temporal  PainSc: Asleep         Complications: No notable events documented.

## 2023-03-23 NOTE — Anesthesia Preprocedure Evaluation (Addendum)
Anesthesia Evaluation  Patient identified by MRN, date of birth, ID band Patient awake    Reviewed: Allergy & Precautions, H&P , NPO status , Patient's Chart, lab work & pertinent test results, reviewed documented beta blocker date and time   History of Anesthesia Complications Negative for: history of anesthetic complications  Airway Mallampati: II  TM Distance: >3 FB Neck ROM: full    Dental  (+) Implants, Dental Advidsory Given, Teeth Intact, Missing   Pulmonary neg pulmonary ROS   Pulmonary exam normal breath sounds clear to auscultation       Cardiovascular Exercise Tolerance: Good hypertension, (-) angina (-) Past MI and (-) Cardiac Stents Normal cardiovascular exam(-) dysrhythmias (-) Valvular Problems/Murmurs Rhythm:regular Rate:Normal     Neuro/Psych negative neurological ROS  negative psych ROS   GI/Hepatic negative GI ROS, Neg liver ROS,,,  Endo/Other  negative endocrine ROS    Renal/GU negative Renal ROS  negative genitourinary   Musculoskeletal   Abdominal   Peds  Hematology negative hematology ROS (+)   Anesthesia Other Findings Past Medical History: No date: Arthritis No date: CTS (carpal tunnel syndrome) No date: HLD (hyperlipidemia) No date: HTN (hypertension) No date: Idiopathic peripheral neuropathy   Reproductive/Obstetrics negative OB ROS                             Anesthesia Physical Anesthesia Plan  ASA: 2  Anesthesia Plan: General   Post-op Pain Management:    Induction: Intravenous  PONV Risk Score and Plan: Propofol infusion and TIVA  Airway Management Planned: Natural Airway  Additional Equipment:   Intra-op Plan:   Post-operative Plan:   Informed Consent: I have reviewed the patients History and Physical, chart, labs and discussed the procedure including the risks, benefits and alternatives for the proposed anesthesia with the patient or  authorized representative who has indicated his/her understanding and acceptance.     Dental Advisory Given  Plan Discussed with: Anesthesiologist, CRNA and Surgeon  Anesthesia Plan Comments:         Anesthesia Quick Evaluation

## 2023-03-23 NOTE — Addendum Note (Signed)
Addendum  created 03/23/23 1313 by Foye Deer, MD   Clinical Note Signed

## 2023-03-23 NOTE — H&P (Signed)
Outpatient short stay form Pre-procedure 03/23/2023  Regis Bill, MD  Primary Physician: Jerl Mina, MD  Reason for visit:  Screening  History of present illness:    67 y/o lady with history of hypertension, obesity, and arthritis here for screening colonoscopy. Had normal colonoscopy in 2013. No blood thinners. No family history of GI malignancies. No significant abdominal surgeries.    Current Facility-Administered Medications:    0.9 %  sodium chloride infusion, , Intravenous, Continuous, Caily Rakers, Rossie Muskrat, MD  Medications Prior to Admission  Medication Sig Dispense Refill Last Dose   CALCIUM PO Take 1 tablet by mouth daily.   Past Week   celecoxib (CELEBREX) 200 MG capsule Take 1 capsule (200 mg total) by mouth 2 (two) times daily. 30 capsule 0 03/21/2023   metoprolol succinate (TOPROL-XL) 25 MG 24 hr tablet Take 25 mg by mouth daily.   03/22/2023   Polyethyl Glycol-Propyl Glycol (SYSTANE) 0.4-0.3 % SOLN Place 1 drop into both eyes daily.   03/23/2023   senna-docusate (SENOKOT-S) 8.6-50 MG tablet Take 1 tablet by mouth 2 (two) times daily. 20 tablet 0 Past Week   simvastatin (ZOCOR) 20 MG tablet Take 20 mg by mouth at bedtime.   Past Week   acetaminophen (TYLENOL) 325 MG tablet Take 1-2 tablets (325-650 mg total) by mouth every 6 (six) hours as needed for mild pain (pain score 1-3 or temp > 100.5).      enoxaparin (LOVENOX) 40 MG/0.4ML injection Inject 0.4 mLs (40 mg total) into the skin daily for 14 days. 5.6 mL 0    ketotifen (ZADITOR) 0.025 % ophthalmic solution Place 1 drop into both eyes daily.      methocarbamol (ROBAXIN) 750 MG tablet Take 500 mg by mouth daily as needed (Back pain).      oxybutynin (DITROPAN-XL) 10 MG 24 hr tablet Take 10 mg by mouth daily.   03/21/2023   oxyCODONE (OXY IR/ROXICODONE) 5 MG immediate release tablet Take 1-2 tablets (5-10 mg total) by mouth every 4 (four) hours as needed for moderate pain (pain score 4-6). 30 tablet 0    traMADol (ULTRAM)  50 MG tablet Take 1-2 tablets (50-100 mg total) by mouth every 4 (four) hours as needed for moderate pain. 30 tablet 0      Allergies  Allergen Reactions   Mometasone     Other reaction(s): Other (See Comments) Nose irritation     Past Medical History:  Diagnosis Date   Arthritis    CTS (carpal tunnel syndrome)    HLD (hyperlipidemia)    HTN (hypertension)    Idiopathic peripheral neuropathy     Review of systems:  Otherwise negative.    Physical Exam  Gen: Alert, oriented. Appears stated age.  HEENT: PERRLA. Lungs: No respiratory distress CV: RRR Abd: soft, benign, no masses Ext: No edema    Planned procedures: Proceed with colonoscopy. The patient understands the nature of the planned procedure, indications, risks, alternatives and potential complications including but not limited to bleeding, infection, perforation, damage to internal organs and possible oversedation/side effects from anesthesia. The patient agrees and gives consent to proceed.  Please refer to procedure notes for findings, recommendations and patient disposition/instructions.     Regis Bill, MD [facility] Gastroenterology

## 2023-03-24 ENCOUNTER — Encounter: Payer: Self-pay | Admitting: Gastroenterology

## 2023-05-12 ENCOUNTER — Ambulatory Visit: Payer: Medicare Other | Admitting: Podiatry

## 2023-05-12 ENCOUNTER — Encounter: Payer: Self-pay | Admitting: Podiatry

## 2023-05-12 ENCOUNTER — Ambulatory Visit (INDEPENDENT_AMBULATORY_CARE_PROVIDER_SITE_OTHER): Payer: Medicare Other

## 2023-05-12 DIAGNOSIS — M7752 Other enthesopathy of left foot: Secondary | ICD-10-CM | POA: Diagnosis not present

## 2023-05-12 DIAGNOSIS — M7672 Peroneal tendinitis, left leg: Secondary | ICD-10-CM | POA: Diagnosis not present

## 2023-05-12 DIAGNOSIS — M25372 Other instability, left ankle: Secondary | ICD-10-CM | POA: Diagnosis not present

## 2023-05-12 DIAGNOSIS — M7750 Other enthesopathy of unspecified foot: Secondary | ICD-10-CM

## 2023-05-12 NOTE — Patient Instructions (Signed)
Look for Voltaren gel at the pharmacy over the counter or online (also known as diclofenac 1% gel). Apply to the painful areas 3-4x daily with the supplied dosing card. Allow to dry for 10 minutes before going into socks/shoes ? ? ?Peroneal Tendinopathy Rehab ?Ask your health care provider which exercises are safe for you. Do exercises exactly as told by your health care provider and adjust them as directed. It is normal to feel mild stretching, pulling, tightness, or discomfort as you do these exercises. Stop right away if you feel sudden pain or your pain gets worse. Do not begin these exercises until told by your health care provider. ?Stretching and range-of-motion exercises ?These exercises warm up your muscles and joints and improve the movement and flexibility of your ankle. These exercises also help to relieve pain and stiffness. ?Gastroc and soleus stretch, standing ? ?This is an exercise in which you stand on a step and use your body weight to stretch your calf muscles. To do this exercise: ?Stand on the edge of a step on the ball of your left / right foot. The ball of your foot is on the walking surface, right under your toes. ?Keep your other foot firmly on the same step. ?Hold on to the wall, a railing, or a chair for balance. ?Slowly lift your other foot, allowing your body weight to press your left / right heel down over the edge of the step. You should feel a stretch in your left / right calf (gastrocnemius and soleus). ?Hold this position for 15 seconds. ?Return both feet to the step. ?Repeat this exercise with a slight bend in your left / right knee. ?Repeat 5 times with your left / right knee straight and 5 times with your left / right knee bent. Complete this exercise 2 times a day. ?Strengthening exercises ?These exercises build strength and endurance in your foot and ankle. Endurance is the ability to use your muscles for a long time, even after they get tired. ?Ankle dorsiflexion with  band ? ? ?Secure a rubber exercise band or tube to an object, such as a table leg, that will not move when the band is pulled. ?Secure the other end of the band around your left / right foot. ?Sit on the floor, facing the object with your left / right leg extended. The band or tube should be slightly tense when your foot is relaxed. ?Slowly flex your left / right ankle and toes to bring your foot toward you (dorsiflexion). ?Hold this position for 15 seconds. ?Let the band or tube slowly pull your foot back to the starting position. ?Repeat 5 times. Complete this exercise 2 times a day. ?Ankle eversion ?Sit on the floor with your legs straight out in front of you. ?Loop a rubber exercise band or tube around the ball of your left / right foot. The ball of your foot is on the walking surface, right under your toes. ?Hold the ends of the band in your hands, or secure the band to a stable object. The band or tube should be slightly tense when your foot is relaxed. ?Slowly push your foot outward, away from your other leg (eversion). ?Hold this position for 15 seconds. ?Slowly return your foot to the starting position. ?Repeat 5 times. Complete this exercise 2 times a day. ?Plantar flexion, standing ? ?This exercise is sometimes called standing heel raise. ?Stand with your feet shoulder-width apart. ?Place your hands on a wall or table to steady yourself as   needed, but try not to use it for support. ?Keep your weight spread evenly over the width of your feet while you slowly rise up on your toes (plantar flexion). If told by your health care provider: ?Shift your weight toward your left / right leg until you feel challenged. ?Stand on your left / right leg only. ?Hold this position for 15 seconds. ?Repeat 2 times. Complete this exercise 2 times a day. ?Single leg stand ?Without shoes, stand near a railing or in a doorway. You may hold on to the railing or door frame as needed. ?Stand on your left / right foot. Keep your  big toe down on the floor and try to keep your arch lifted. ?Do not roll to the outside of your foot. ?If this exercise is too easy, you can try it with your eyes closed or while standing on a pillow. ?Hold this position for 15 seconds. ?Repeat 5 times. Complete this exercise 2 times a day. ?This information is not intended to replace advice given to you by your health care provider. Make sure you discuss any questions you have with your health care provider. ?Document Revised: 03/21/2019 Document Reviewed: 03/21/2019 ?Elsevier Patient Education ? 2020 [employer] ? ?

## 2023-05-14 NOTE — Progress Notes (Signed)
  Subjective:  Patient ID: Wanda Lynch, female    DOB: 1955/12/15,  MRN: [id number]  Chief Complaint  Patient presents with   Ankle Pain    NP-Left ankle pain, started last September. She had xrays in October, tried wearing a boot and tried wearing a brace but both seemed to make the pain worse. New xrays taken today    67 y.o. female presents with the above complaint. History confirmed with patient.   Objective:  Physical Exam: warm, good capillary refill, no trophic changes or ulcerative lesions, normal DP and PT pulses, normal sensory exam, and any tenderness along the peroneal tendons and lateral ankle ligaments, mild increased pain with anterior drawer, no gross instability   New left ankle radiographs taken today show no acute osseous abnormalities Assessment:   1. Peroneal tendinitis, left   2. Ankle instability, left      Plan:  Patient was evaluated and treated and all questions answered.  Discussed the etiology and treatment options for peroneal tendinitis including stretching, formal physical therapy with an eccentric exercises therapy plan, supportive shoegears such as a running shoe or sneaker, bracing, topical and oral medications.  Also discussed she likely has some functional ankle instability  -XR reviewed with patient -Educated on stretching and icing of the affected limb. -Referral placed to physical therapy. -MRI if not improving or worsening  Return in about 8 weeks (around 07/07/2023) for re-check peroneal tendinitis.

## 2023-09-15 ENCOUNTER — Ambulatory Visit: Payer: Medicare Other | Admitting: Podiatry

## 2023-09-15 ENCOUNTER — Encounter: Payer: Self-pay | Admitting: Podiatry

## 2023-09-15 VITALS — BP 152/73 | HR 72

## 2023-09-15 DIAGNOSIS — B353 Tinea pedis: Secondary | ICD-10-CM

## 2023-09-15 DIAGNOSIS — B351 Tinea unguium: Secondary | ICD-10-CM

## 2023-09-15 DIAGNOSIS — M7672 Peroneal tendinitis, left leg: Secondary | ICD-10-CM | POA: Diagnosis not present

## 2023-09-15 DIAGNOSIS — M25372 Other instability, left ankle: Secondary | ICD-10-CM | POA: Diagnosis not present

## 2023-09-15 MED ORDER — KETOCONAZOLE 2 % EX CREA: 1.0000 | TOPICAL_CREAM | Freq: Every day | CUTANEOUS | 2 refills | Status: AC

## 2023-09-15 MED ORDER — TERBINAFINE HCL 250 MG PO TABS: 250.0000 mg | ORAL_TABLET | Freq: Every day | ORAL | 0 refills | Status: AC

## 2023-09-15 NOTE — Patient Instructions (Signed)
Call [facility] to schedule your MRI at the below locations.  Please allow at least 1 business day after your visit to process the referral.  It may take longer depending on approval from insurance.  Please let me know if you have issues or problems scheduling the MRI   [facility] [phone] [address] [place], [place] [address]  [facility] [phone] [address] [place], [place] [address]  

## 2023-09-15 NOTE — Progress Notes (Signed)
  Subjective:  Patient ID: Wanda Lynch, female    DOB: November 25, 1956,  MRN: [id number]  Chief Complaint  Patient presents with   tendinitis    "It's on and off, if I overdo it.  It just won't go away."    67 y.o. female presents with the above complaint. History confirmed with patient.  She has completed physical therapy she did nearly 2 months worth, then she traveled and got worse again she feels gets back to where it was.  She also notes peeling and itchy skin on the left foot as well as thickening discoloration of her nails  Objective:  Physical Exam: warm, good capillary refill, no trophic changes or ulcerative lesions, normal DP and PT pulses, normal sensory exam, and any tenderness along the peroneal tendons and lateral ankle ligaments, mild increased pain with anterior drawer, no gross instability.  Tinea pedis and onychomycosis  New left ankle radiographs taken today show no acute osseous abnormalities Assessment:   1. Peroneal tendinitis, left   2. Onychomycosis   3. Tinea pedis, left   4. Ankle instability, left      Plan:  Patient was evaluated and treated and all questions answered.  Discussed the etiology and treatment options for tinea pedis and nail fungus.  Discussed topical and oral treatment.  Recommended topical treatment with 2% ketoconazole cream and oral treatment with Lamisil.  This was sent to the patient's pharmacy.  Also discussed appropriate foot hygiene, use of antifungal spray such as Tinactin in shoes, as well as cleaning her foot surfaces such as showers and bathroom floors with bleach.  Her peroneal tendinitis and lateral ankle instability continues to worsen and has been very problematic.  With her significant physical therapy expected to be resolved at this point if this was simple tendinitis.  I am suspicious of a lateral ankle ligament or peroneal tendon tear.  I recommend an MRI to evaluate.  MRI has been ordered and she will follow-up me after the  study to discuss further treatment options including possible surgery.  Return for 2 weeks after MRI to review.

## 2023-09-17 ENCOUNTER — Ambulatory Visit
Admission: RE | Admit: 2023-09-17 | Discharge: 2023-09-17 | Disposition: A | Discharge status: Home / Self Care | Payer: Medicare Other | Source: Ambulatory Visit | Attending: Podiatry

## 2023-09-17 DIAGNOSIS — M7672 Peroneal tendinitis, left leg: Secondary | ICD-10-CM

## 2023-10-11 ENCOUNTER — Telehealth: Payer: Self-pay

## 2023-10-11 ENCOUNTER — Ambulatory Visit (INDEPENDENT_AMBULATORY_CARE_PROVIDER_SITE_OTHER): Payer: Medicare Other

## 2023-10-11 ENCOUNTER — Ambulatory Visit: Payer: Medicare Other | Admitting: Podiatry

## 2023-10-11 ENCOUNTER — Encounter: Payer: Self-pay | Admitting: Podiatry

## 2023-10-11 VITALS — BP 163/79 | HR 59

## 2023-10-11 DIAGNOSIS — S86312A Strain of muscle(s) and tendon(s) of peroneal muscle group at lower leg level, left leg, initial encounter: Secondary | ICD-10-CM

## 2023-10-11 DIAGNOSIS — S92352K Displaced fracture of fifth metatarsal bone, left foot, subsequent encounter for fracture with nonunion: Secondary | ICD-10-CM | POA: Diagnosis not present

## 2023-10-11 DIAGNOSIS — M25572 Pain in left ankle and joints of left foot: Secondary | ICD-10-CM

## 2023-10-11 DIAGNOSIS — M7742 Metatarsalgia, left foot: Secondary | ICD-10-CM | POA: Diagnosis not present

## 2023-10-11 DIAGNOSIS — E559 Vitamin D deficiency, unspecified: Secondary | ICD-10-CM | POA: Diagnosis not present

## 2023-10-11 NOTE — Telephone Encounter (Signed)
Wanda Lynch, with [facility] called regarding MRI results. She would like to bring your attention to   IMPRESSION: 1. Nondisplaced ununited fracture of the base of the fifth metatarsal with the fracture cleft extending to the TMT joint and with severe surrounding bone marrow edema.

## 2023-10-12 NOTE — Progress Notes (Addendum)
Subjective:  Patient ID: Wanda Lynch, female    DOB: [birth year],  MRN: [id number]  Chief Complaint  Patient presents with   Fracture    "It's okay.  When I walk with a shoe it hits that spot right there and it's painful."    67 y.o. female presents with the above complaint. History confirmed with patient.  She completed the MRI.  It is still bothersome and painful.  Objective:  Physical Exam: warm, good capillary refill, no trophic changes or ulcerative lesions, normal DP and PT pulses, normal sensory exam, and today she has slight tenderness over the peroneal tendons but not severe, minimal pain at the fifth metatarsal base and sharp pain in the sinus tarsi   New left ankle radiographs with multiple views taken today show new fracture of base of fifth metatarsal intra-articular extension   Study Result  Narrative & Impression  CLINICAL DATA:  Left ankle pain with a lump protruding on the outside of the ankle.   EXAM: MRI OF THE LEFT ANKLE WITHOUT CONTRAST   TECHNIQUE: Multiplanar, multisequence MR imaging of the ankle was performed. No intravenous contrast was administered.   COMPARISON:  None Available.   FINDINGS: TENDONS   Peroneal: Severe tendinosis of the peroneus longus just distal to the lateral malleolus with a longitudinal split tear and subcortical reactive marrow edema in the peroneal tubercle. Moderate tendinosis of the peroneus brevis with a short-segment longitudinal split tear of the peroneal tubercle. Mild peroneal tenosynovitis.   Posteromedial: Posterior tibial tendon intact. Flexor hallucis longus tendon intact. Flexor digitorum longus tendon intact.   Anterior: Tibialis anterior tendon intact. Extensor hallucis longus tendon intact Extensor digitorum longus tendon intact.   Achilles: Intact. Mild enthesopathic changes of the Achilles tendon insertion.   Plantar Fascia: Intact. Small plantar calcaneal spur.   LIGAMENTS   Lateral: Anterior  talofibular ligament intact. Calcaneofibular ligament intact. Posterior talofibular ligament intact. Anterior and posterior tibiofibular ligaments intact.   Medial: Deltoid ligament intact. Spring ligament intact.   CARTILAGE   Ankle Joint: No joint effusion. Normal ankle mortise. No chondral defect.   Subtalar Joints/Sinus Tarsi: Normal subtalar joints. No subtalar joint effusion. Normal sinus tarsi.   Bones: No aggressive osseous lesion. Nondisplaced ununited fracture of the base of the fifth metatarsal with the fracture cleft extending to the TMT joint and with severe surrounding bone marrow edema. 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 Mild osteoarthritis of the fourth TMT joint.   Soft Tissue: No fluid collection or hematoma. Muscles are normal without edema or atrophy. Tarsal tunnel is normal.   IMPRESSION: 1. Nondisplaced ununited fracture of the base of the fifth metatarsal with the fracture cleft extending to the TMT joint and with severe surrounding bone marrow edema. 2. Severe tendinosis of the peroneus longus just distal to the lateral malleolus with a longitudinal split tear and subcortical reactive marrow edema in the peroneal tubercle. Moderate tendinosis of the peroneus brevis with a short-segment longitudinal split tear of the peroneal tubercle. Mild peroneal tenosynovitis.     Electronically Signed   By: Elige Ko M.D.   On: 10/11/2023 12:02    1. 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 Vitamin D deficiency   3. Peroneal tendon tear, left, initial encounter      Plan:  Patient was evaluated and treated and all questions answered.  We reviewed her radiographs taken today as well as her MRI.  She has experienced  a base of the fifth metatarsal fracture since her x-rays taken in May.  She relates this probably happened when she fell in [place] in August in the middle of the month most  likely on August 15.  She is relatively nontender here as well as the tears in the peroneal tendons.  Most of her pain is in the sinus tarsi.  I recommended nonoperative treatment for the fracture and tearing at this point utilizing a cam walker boot which she has at home and will begin to use again.  I would like to rex-ray in 1 month.  I discussed with her I would like to have her vitamin D level checked to see if this is contributing to any nonunion of the fracture site.  If not improving by next visit then we will plan for noninvasive bone marrow stimulation.  May develop into an asymptomatic nonunion.  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  I recommended corticosteroid injection.  Following sterile prep with Betadine and consent 10 mg of Kenalog 2 mg of dexamethasone and 1 cc of 0.5% Marcaine plain was injected in the left subtalar and sinus tarsi.  She tolerated this well.  Return in about 23 days (around 11/03/2023) for fracture follow up (new xrays).

## 2023-10-13 LAB — VITAMIN D 25 HYDROXY (VIT D DEFICIENCY, FRACTURES): Vit D, 25-Hydroxy: 38.8 ng/mL (ref 30.0–100.0)

## 2023-11-03 ENCOUNTER — Ambulatory Visit: Payer: Medicare Other | Admitting: Podiatry

## 2023-11-08 ENCOUNTER — Encounter: Payer: Self-pay | Admitting: Podiatry

## 2023-11-08 ENCOUNTER — Ambulatory Visit: Payer: Medicare Other | Admitting: Podiatry

## 2023-11-08 ENCOUNTER — Ambulatory Visit (INDEPENDENT_AMBULATORY_CARE_PROVIDER_SITE_OTHER): Payer: Medicare Other

## 2023-11-08 DIAGNOSIS — S92352K Displaced fracture of fifth metatarsal bone, left foot, subsequent encounter for fracture with nonunion: Secondary | ICD-10-CM | POA: Diagnosis not present

## 2023-11-09 NOTE — Progress Notes (Signed)
Subjective:  Patient ID: Wanda Lynch, female    DOB: 1956/08/04,  MRN: [id number]  Chief Complaint  Patient presents with   Fracture    "About two weeks ago, It started hurting really bad.  I couldn't put any weight on it.  It hurts anytime I put a shoe on."    67 y.o. female presents with the above complaint. History confirmed with patient.  She returns for follow-up on her fracture of her left foot, has had more pain in the peroneal tendons today  Objective:  Physical Exam: warm, good capillary refill, no trophic changes or ulcerative lesions, normal DP and PT pulses, normal sensory exam, and today she has pain over the peroneal tendons and the fracture site   New left foot radiographs with multiple views today show continued nonunited fracture of the left fifth metatarsal base, fracture gap 9 mm with minimal bridging  Study Result  Narrative & Impression  CLINICAL DATA:  Left ankle pain with a lump protruding on the outside of the ankle.   EXAM: MRI OF THE LEFT ANKLE WITHOUT CONTRAST   TECHNIQUE: Multiplanar, multisequence MR imaging of the ankle was performed. No intravenous contrast was administered.   COMPARISON:  None Available.   FINDINGS: TENDONS   Peroneal: Severe tendinosis of the peroneus longus just distal to the lateral malleolus with a longitudinal split tear and subcortical reactive marrow edema in the peroneal tubercle. Moderate tendinosis of the peroneus brevis with a short-segment longitudinal split tear of the peroneal tubercle. Mild peroneal tenosynovitis.   Posteromedial: Posterior tibial tendon intact. Flexor hallucis longus tendon intact. Flexor digitorum longus tendon intact.   Anterior: Tibialis anterior tendon intact. Extensor hallucis longus tendon intact Extensor digitorum longus tendon intact.   Achilles: Intact. Mild enthesopathic changes of the Achilles tendon insertion.   Plantar Fascia: Intact. Small plantar calcaneal spur.    LIGAMENTS   Lateral: Anterior talofibular ligament intact. Calcaneofibular ligament intact. Posterior talofibular ligament intact. Anterior and posterior tibiofibular ligaments intact.   Medial: Deltoid ligament intact. Spring ligament intact.   CARTILAGE   Ankle Joint: No joint effusion. Normal ankle mortise. No chondral defect.   Subtalar Joints/Sinus Tarsi: Normal subtalar joints. No subtalar joint effusion. Normal sinus tarsi.   Bones: No aggressive osseous lesion. Nondisplaced ununited fracture of the base of the fifth metatarsal with the fracture cleft extending to the TMT joint and with severe surrounding bone marrow edema. 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 Mild osteoarthritis of the fourth TMT joint.   Soft Tissue: No fluid collection or hematoma. Muscles are normal without edema or atrophy. Tarsal tunnel is normal.   IMPRESSION: 1. Nondisplaced ununited fracture of the base of the fifth metatarsal with the fracture cleft extending to the TMT joint and with severe surrounding bone marrow edema. 2. Severe tendinosis of the peroneus longus just distal to the lateral malleolus with a longitudinal split tear and subcortical reactive marrow edema in the peroneal tubercle. Moderate tendinosis of the peroneus brevis with a short-segment longitudinal split tear of the peroneal tubercle. Mild peroneal tenosynovitis.     Electronically Signed   By: Elige Ko M.D.   On: 10/11/2023 12:02    1. 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  So far has had minimal bony bridging.  She did not wear her cam boot because she only had a tall boot  and this was too bulky for her.  A short boot was dispensed today and she may be weightbearing as tolerated in this.  This fracture occurred in mid August on August 15 of this year while she was  in [place].  Considering has been 3 months since initial injury I have recommended a noninvasive bone growth stimulator.  Referral sent to [facility].  She will begin using as soon as we can get it and return in 6 weeks for new weightbearing radiographs.  Return in about 6 weeks (around 12/20/2023) for left foot fracture (new xrays).

## 2023-11-16 NOTE — Addendum Note (Signed)
Addended byLilian Kapur, Taquanna Borras R on: 11/16/2023 01:58 PM   Modules accepted: Orders

## 2023-12-13 ENCOUNTER — Other Ambulatory Visit: Payer: Self-pay | Admitting: Family Medicine

## 2023-12-13 DIAGNOSIS — Z1231 Encounter for screening mammogram for malignant neoplasm of breast: Secondary | ICD-10-CM

## 2023-12-20 ENCOUNTER — Ambulatory Visit (INDEPENDENT_AMBULATORY_CARE_PROVIDER_SITE_OTHER): Payer: Medicare Other

## 2023-12-20 ENCOUNTER — Ambulatory Visit: Payer: Medicare Other | Admitting: Podiatry

## 2023-12-20 ENCOUNTER — Encounter: Payer: Self-pay | Admitting: Podiatry

## 2023-12-20 DIAGNOSIS — S92352K Displaced fracture of fifth metatarsal bone, left foot, subsequent encounter for fracture with nonunion: Secondary | ICD-10-CM

## 2023-12-20 DIAGNOSIS — S86312A Strain of muscle(s) and tendon(s) of peroneal muscle group at lower leg level, left leg, initial encounter: Secondary | ICD-10-CM

## 2023-12-20 NOTE — Patient Instructions (Signed)
 VISIT SUMMARY:  Today, you were seen for right foot pain, particularly between the fourth and fifth toes and in the midfoot dorsal region. You described a hard lump between the toes that becomes painful when it thickens, especially when shoes compress the area. You have been managing the pain with tramadol  and have not tried Tylenol  due to sleepiness. NSAIDs are not an option because of your blood thinner use. 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  YOUR PLAN:  -MIDFOOT ARTHRITIS: Midfoot arthritis is a condition where the joints in the middle of the foot become worn out and painful. We injected the right second TMT joint with medication to reduce inflammation and recommended using over-the-counter and custom molded foot orthoses, along with a carbon fiber insole to stiffen your shoes, especially for golfing. If necessary, corticosteroid injections can be repeated 3-4 times a year, and surgical fusion of the joints might be considered if the condition does not improve or worsens.  -INTERDIGITAL CORN: An interdigital corn is a hard lump that forms between the toes, causing pain. We provided a silicone toe spacer and debrided the lesion to a tolerable level. You were advised to choose shoes that do not compress your toes together and informed that surgical options are available if the condition worsens.  INSTRUCTIONS:  Follow up as needed.

## 2023-12-20 NOTE — Progress Notes (Signed)
 Subjective:  Patient ID: Wanda Lynch, female    DOB: 1956-08-16,  MRN: [id number]  Chief Complaint  Patient presents with   Fracture    With the fracture, I don't feel any pain.  My ankle still hurts.  The boot made the side of my foot worse.    68 y.o. female presents with the above complaint. History confirmed with patient.  She returns for follow-up on her fracture of her left foot, has had more pain in the peroneal tendons than she does at the fracture site she feels like it feels worse in the boot  Objective:  Physical Exam: warm, good capillary refill, no trophic changes or ulcerative lesions, normal DP and PT pulses, normal sensory exam, and today she has pain over the peroneal tendons does not have pain over fifth metatarsal base all pain is in the tendons at the retro and distal malleolar areas  New left foot radiographs with multiple views today show unchanged alignment and ununited fifth metatarsal fracture  Study Result  Narrative & Impression  CLINICAL DATA:  Left ankle pain with a lump protruding on the outside of the ankle.   EXAM: MRI OF THE LEFT ANKLE WITHOUT CONTRAST   TECHNIQUE: Multiplanar, multisequence MR imaging of the ankle was performed. No intravenous contrast was administered.   COMPARISON:  None Available.   FINDINGS: TENDONS   Peroneal: Severe tendinosis of the peroneus longus just distal to the lateral malleolus with a longitudinal split tear and subcortical reactive marrow edema in the peroneal tubercle. Moderate tendinosis of the peroneus brevis with a short-segment longitudinal split tear of the peroneal tubercle. Mild peroneal tenosynovitis.   Posteromedial: Posterior tibial tendon intact. Flexor hallucis longus tendon intact. Flexor digitorum longus tendon intact.   Anterior: Tibialis anterior tendon intact. Extensor hallucis longus tendon intact Extensor digitorum longus tendon intact.   Achilles: Intact. Mild enthesopathic  changes of the Achilles tendon insertion.   Plantar Fascia: Intact. Small plantar calcaneal spur.   LIGAMENTS   Lateral: Anterior talofibular ligament intact. Calcaneofibular ligament intact. Posterior talofibular ligament intact. Anterior and posterior tibiofibular ligaments intact.   Medial: Deltoid ligament intact. Spring ligament intact.   CARTILAGE   Ankle Joint: No joint effusion. Normal ankle mortise. No chondral defect.   Subtalar Joints/Sinus Tarsi: Normal subtalar joints. No subtalar joint effusion. Normal sinus tarsi.   Bones: No aggressive osseous lesion. Nondisplaced ununited fracture of the base of the fifth metatarsal with the fracture cleft extending to the TMT joint and with severe surrounding bone marrow edema. 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 Mild osteoarthritis of the fourth TMT joint.   Soft Tissue: No fluid collection or hematoma. Muscles are normal without edema or atrophy. Tarsal tunnel is normal.   IMPRESSION: 1. Nondisplaced ununited fracture of the base of the fifth metatarsal with the fracture cleft extending to the TMT joint and with severe surrounding bone marrow edema. 2. Severe tendinosis of the peroneus longus just distal to the lateral malleolus with a longitudinal split tear and subcortical reactive marrow edema in the peroneal tubercle. Moderate tendinosis of the peroneus brevis with a short-segment longitudinal split tear of the peroneal tubercle. Mild peroneal tenosynovitis.     Electronically Signed   By: Julaine Blanch M.D.   On: 10/11/2023 12:02    1. 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 Peroneal tendon tear, left, initial encounter      Plan:  Patient was evaluated and  treated and all questions answered.  Still no further bony bridging.  A bone growth stimulator was only partially covered and was going to be over $500 out-of-pocket cost.  With  the main fracture gap that she has I do not think that that will be able to bridge that at this point.  Additionally her peroneal tendons are quite painful.  She says she feels worse in the boot currently.  We discussed alternative bracing options such as a lace up figure-of-eight ankle brace.  She will try this to see if it is helpful.  I recommended surgical intervention for these issues at for her today.  We discussed peroneal tendon repair excision of the fracture site with anchoring, and due to her significant pes cavus deformity I recommended Dwyer calcaneal osteotomy and dorsiflexion osteotomy of the first metatarsal to alleviate the impact of her bony deformity on her tendon tears to facilitate rehabilitation and reduce her chance of reinjury.  She will consider options.  We discussed the recovery period.  She will follow-up me in 1 month for new x-rays or sooner if she is ready to proceed with surgical intervention but she would like to wait for now.  May also consider [place]  brace if she chooses against surgery to rehab her shoulder.  Return in about 1 month (around 01/20/2024) for new fracture xrays, discuss surgery .

## 2023-12-30 ENCOUNTER — Ambulatory Visit
Admission: RE | Admit: 2023-12-30 | Discharge: 2023-12-30 | Disposition: A | Discharge status: Home / Self Care | Payer: Medicare Other | Source: Ambulatory Visit | Attending: Family Medicine | Admitting: Family Medicine

## 2023-12-30 DIAGNOSIS — Z1231 Encounter for screening mammogram for malignant neoplasm of breast: Secondary | ICD-10-CM

## 2024-01-19 ENCOUNTER — Ambulatory Visit: Payer: Medicare Other | Admitting: Podiatry

## 2024-01-19 ENCOUNTER — Ambulatory Visit (INDEPENDENT_AMBULATORY_CARE_PROVIDER_SITE_OTHER): Payer: Medicare Other

## 2024-01-19 DIAGNOSIS — S92352K Displaced fracture of fifth metatarsal bone, left foot, subsequent encounter for fracture with nonunion: Secondary | ICD-10-CM | POA: Diagnosis not present

## 2024-01-19 DIAGNOSIS — Q6672 Congenital pes cavus, left foot: Secondary | ICD-10-CM | POA: Diagnosis not present

## 2024-01-19 DIAGNOSIS — S86312A Strain of muscle(s) and tendon(s) of peroneal muscle group at lower leg level, left leg, initial encounter: Secondary | ICD-10-CM | POA: Diagnosis not present

## 2024-01-20 NOTE — Progress Notes (Signed)
 Subjective:  Patient ID: Wanda Lynch, female    DOB: 11/29/1956,  MRN: [id number]  Chief Complaint  Patient presents with   Fracture    It's not getting better, the pain is still there.    68 y.o. female presents with the above complaint. History confirmed with patient.  She returns for follow-up on her fracture of her left foot, symptoms are unchanged she is still having quite a bit of pain in the tendons and this has been very limiting for her.  Objective:  Physical Exam: warm, good capillary refill, no trophic changes or ulcerative lesions, normal DP and PT pulses, normal sensory exam, and today she has pain over the peroneal tendons does not have pain over fifth metatarsal base all pain is in the tendons at the retro and distal malleolar areas, cavus foot type  New left foot radiographs with multiple views today show unchanged alignment and ununited fifth metatarsal fracture, no increase in fracture She has pes cavus foot deformity  Study Result  Narrative & Impression  CLINICAL DATA:  Left ankle pain with a lump protruding on the outside of the ankle.   EXAM: MRI OF THE LEFT ANKLE WITHOUT CONTRAST   TECHNIQUE: Multiplanar, multisequence MR imaging of the ankle was performed. No intravenous contrast was administered.   COMPARISON:  None Available.   FINDINGS: TENDONS   Peroneal: Severe tendinosis of the peroneus longus just distal to the lateral malleolus with a longitudinal split tear and subcortical reactive marrow edema in the peroneal tubercle. Moderate tendinosis of the peroneus brevis with a short-segment longitudinal split tear of the peroneal tubercle. Mild peroneal tenosynovitis.   Posteromedial: Posterior tibial tendon intact. Flexor hallucis longus tendon intact. Flexor digitorum longus tendon intact.   Anterior: Tibialis anterior tendon intact. Extensor hallucis longus tendon intact Extensor digitorum longus tendon intact.   Achilles: Intact. Mild  enthesopathic changes of the Achilles tendon insertion.   Plantar Fascia: Intact. Small plantar calcaneal spur.   LIGAMENTS   Lateral: Anterior talofibular ligament intact. Calcaneofibular ligament intact. Posterior talofibular ligament intact. Anterior and posterior tibiofibular ligaments intact.   Medial: Deltoid ligament intact. Spring ligament intact.   CARTILAGE   Ankle Joint: No joint effusion. Normal ankle mortise. No chondral defect.   Subtalar Joints/Sinus Tarsi: Normal subtalar joints. No subtalar joint effusion. Normal sinus tarsi.   Bones: No aggressive osseous lesion. Nondisplaced ununited fracture of the base of the fifth metatarsal with the fracture cleft extending to the TMT joint and with severe surrounding bone marrow edema. 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 Mild osteoarthritis of the fourth TMT joint.   Soft Tissue: No fluid collection or hematoma. Muscles are normal without edema or atrophy. Tarsal tunnel is normal.   IMPRESSION: 1. Nondisplaced ununited fracture of the base of the fifth metatarsal with the fracture cleft extending to the TMT joint and with severe surrounding bone marrow edema. 2. Severe tendinosis of the peroneus longus just distal to the lateral malleolus with a longitudinal split tear and subcortical reactive marrow edema in the peroneal tubercle. Moderate tendinosis of the peroneus brevis with a short-segment longitudinal split tear of the peroneal tubercle. Mild peroneal tenosynovitis.     Electronically Signed   By: Julaine Blanch M.D.   On: 10/11/2023 12:02    1. 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  We reviewed her progress in  today's radiographs discussed that there has been minimal change in alignment of the fracture and the peroneal tendon tears are still quite painful.  We  discussed progression to surgical treatment, she has exhausted all nonsurgical treatment options at this point.  We discussed repair of the peroneal tendon tears which may require a graft depending on the severity of the tear at the time of surgery.  I discussed with her that simple repair likely could lead to retearing due to her pes cavus deformity.  I recommended treatment of this as well with gastrocnemius recession, Dwyer calcaneal osteotomy and first metatarsal dorsiflexion osteotomy to address the deformity, we also discussed excising the fracture fragment and transferring the peroneus longus to the brevis and reanchoring the peroneus brevis to the fifth metatarsal.  Discussed the recovery process that I expect she will be nonweightbearing for at least 6 weeks followed by progressive gradual weightbearing for an additional 4 to 6 weeks.  Risks and benefits discussed.  We discussed all potential locations including but limited to pain, swelling, infection, scar, numbness which may be temporary or permanent, chronic pain, stiffness, nerve pain or damage, wound healing problems, bone healing problems including delayed or non-union.  She understands wishes to proceed.  Informed consent signed and reviewed.   Surgical plan:  Procedure: -Cavus reconstruction with Dwyer osteotomy, first metatarsal osteotomy gastrocnemius recession PL to PB transfer, peroneal tendon repair and excision of fracture fragment and anchoring of tendon  Location: -[facility]   Anesthesia plan: -General With block  Postoperative pain plan: - Tylenol  1000 mg every 6 hours, gabapentin  300 mg every 8 hours x5 days, oxycodone  5 mg 1-2 tabs every 6 hours only as needed  DVT prophylaxis: -Xarelto  10 mg nightly postop  WB Restrictions / DME needs: -NWB in splint    No follow-ups on file.

## 2024-02-01 ENCOUNTER — Telehealth: Payer: Self-pay | Admitting: Podiatry

## 2024-02-01 NOTE — Telephone Encounter (Signed)
 DOS-03/01/24  TENODESIS [id number] EVAN CALCANEAL OSTEOTOMY [id number] MET OSTEOTOMY 1ST [id number]  [facility] EFFECTIVE DATE- 12/15/23  PER THE [employer] PORTAL, PRIOR AUTH IS NOT REQUIRED FOR CPT CODES [phone] AND [id number].  AUTH DECISION ID: [id number]

## 2024-03-01 ENCOUNTER — Other Ambulatory Visit: Payer: Self-pay | Admitting: Podiatry

## 2024-03-01 DIAGNOSIS — M216X2 Other acquired deformities of left foot: Secondary | ICD-10-CM | POA: Diagnosis not present

## 2024-03-01 DIAGNOSIS — M66372 Spontaneous rupture of flexor tendons, left ankle and foot: Secondary | ICD-10-CM | POA: Diagnosis not present

## 2024-03-01 DIAGNOSIS — S92352K Displaced fracture of fifth metatarsal bone, left foot, subsequent encounter for fracture with nonunion: Secondary | ICD-10-CM | POA: Diagnosis not present

## 2024-03-01 DIAGNOSIS — Q6672 Congenital pes cavus, left foot: Secondary | ICD-10-CM | POA: Diagnosis not present

## 2024-03-01 DIAGNOSIS — M7742 Metatarsalgia, left foot: Secondary | ICD-10-CM | POA: Diagnosis not present

## 2024-03-01 MED ORDER — ACETAMINOPHEN 500 MG PO TABS: 1000.0000 mg | ORAL_TABLET | Freq: Four times a day (QID) | ORAL | 0 refills | Status: AC | PRN

## 2024-03-01 MED ORDER — OXYCODONE HCL 5 MG PO TABS: 5.0000 mg | ORAL_TABLET | Freq: Four times a day (QID) | ORAL | 0 refills | Status: AC | PRN

## 2024-03-01 MED ORDER — GABAPENTIN 300 MG PO CAPS: 300.0000 mg | ORAL_CAPSULE | Freq: Three times a day (TID) | ORAL | 0 refills | Status: DC

## 2024-03-01 MED ORDER — RIVAROXABAN 10 MG PO TABS: 10.0000 mg | ORAL_TABLET | Freq: Every day | ORAL | 0 refills | Status: AC

## 2024-03-01 NOTE — Progress Notes (Signed)
 3/19 cavus recon w/ tendon repairs

## 2024-03-04 ENCOUNTER — Other Ambulatory Visit: Payer: Self-pay | Admitting: Podiatry

## 2024-03-07 ENCOUNTER — Encounter: Payer: Medicare Other | Admitting: Podiatry

## 2024-03-07 ENCOUNTER — Ambulatory Visit (INDEPENDENT_AMBULATORY_CARE_PROVIDER_SITE_OTHER): Admitting: Podiatry

## 2024-03-07 ENCOUNTER — Encounter: Payer: Self-pay | Admitting: Podiatry

## 2024-03-07 ENCOUNTER — Ambulatory Visit (INDEPENDENT_AMBULATORY_CARE_PROVIDER_SITE_OTHER)

## 2024-03-07 DIAGNOSIS — S92352K Displaced fracture of fifth metatarsal bone, left foot, subsequent encounter for fracture with nonunion: Secondary | ICD-10-CM

## 2024-03-07 DIAGNOSIS — Z9889 Other specified postprocedural states: Secondary | ICD-10-CM

## 2024-03-07 DIAGNOSIS — S86312A Strain of muscle(s) and tendon(s) of peroneal muscle group at lower leg level, left leg, initial encounter: Secondary | ICD-10-CM

## 2024-03-07 MED ORDER — GABAPENTIN 300 MG PO CAPS: 300.0000 mg | ORAL_CAPSULE | Freq: Three times a day (TID) | ORAL | 0 refills | Status: DC

## 2024-03-07 MED ORDER — DOXYCYCLINE HYCLATE 100 MG PO TABS
100.0000 mg | ORAL_TABLET | Freq: Two times a day (BID) | ORAL | 0 refills | Status: DC
Start: 2024-03-07 — End: 2024-04-12

## 2024-03-07 MED ORDER — IBUPROFEN 800 MG PO TABS: 800.0000 mg | ORAL_TABLET | Freq: Four times a day (QID) | ORAL | 1 refills | Status: DC | PRN

## 2024-03-07 NOTE — Progress Notes (Addendum)
 Subjective:  Patient ID: Wanda Lynch, female    DOB: August 07, 1956,  MRN: [id number]  Chief Complaint  Patient presents with   Routine Post Op    DOS 03/01/24 --- REPAIR OF PERONEAL TENDONS, POSSIBLE GRAFT AND TRANSFER, REMOVAL OF FRACTURE FRAGMENT , BONE CUT IN HEEL, BONE AND METATARSAL , CALF MUSCLE LEGTHENING  Pt is here for first post op visit, she denies any fever chills nausea or vomiting she stated that her pain is controlled       68 y.o. female returns for post-op check.  Patient states that she is doing well her pain is controlled with gabapentin and Tylenol.  She does not like taking OxyContin.  She would like to have a refill.  Bandages clean dry and intact denies any other acute complaints nonweightbearing to the left lower extremity  Review of Systems: Negative except as noted in the HPI. Denies N/V/F/Ch.  Past Medical History:  Diagnosis Date   Arthritis    CTS (carpal tunnel syndrome)    HLD (hyperlipidemia)    HTN (hypertension)    Idiopathic peripheral neuropathy     Current Outpatient Medications:    acetaminophen (TYLENOL) 500 MG tablet, Take 2 tablets (1,000 mg total) by mouth every 6 (six) hours as needed for up to 14 days (pain)., Disp: 112 tablet, Rfl: 0   CALCIUM PO, Take 1 tablet by mouth daily., Disp: , Rfl:    celecoxib (CELEBREX) 200 MG capsule, Take 1 capsule (200 mg total) by mouth 2 (two) times daily., Disp: 30 capsule, Rfl: 0   doxycycline (VIBRA-TABS) 100 MG tablet, Take 1 tablet (100 mg total) by mouth 2 (two) times daily., Disp: 20 tablet, Rfl: 0   gabapentin (NEURONTIN) 300 MG capsule, Take 1 capsule (300 mg total) by mouth 3 (three) times daily for 7 days., Disp: 21 capsule, Rfl: 0   ibuprofen (ADVIL) 800 MG tablet, Take 1 tablet (800 mg total) by mouth every 6 (six) hours as needed., Disp: 60 tablet, Rfl: 1   ketoconazole (NIZORAL) 2 % cream, Apply 1 Application topically daily., Disp: 60 g, Rfl: 2   ketotifen (ZADITOR) 0.025 % ophthalmic  solution, Place 1 drop into both eyes daily., Disp: , Rfl:    methocarbamol (ROBAXIN) 750 MG tablet, Take 500 mg by mouth daily as needed (Back pain). (Patient not taking: Reported on 10/11/2023), Disp: , Rfl:    metoprolol succinate (TOPROL-XL) 25 MG 24 hr tablet, Take 25 mg by mouth daily., Disp: , Rfl:    oxybutynin (DITROPAN-XL) 10 MG 24 hr tablet, Take 10 mg by mouth daily., Disp: , Rfl:    oxyCODONE (OXY IR/ROXICODONE) 5 MG immediate release tablet, Take 1 tablet (5 mg total) by mouth every 6 (six) hours as needed for up to 7 days for severe pain (pain score 7-10)., Disp: 20 tablet, Rfl: 0   Polyethyl Glycol-Propyl Glycol (SYSTANE) 0.4-0.3 % SOLN, Place 1 drop into both eyes daily., Disp: , Rfl:    rivaroxaban (XARELTO) 10 MG TABS tablet, Take 1 tablet (10 mg total) by mouth daily., Disp: 30 tablet, Rfl: 0   senna-docusate (SENOKOT-S) 8.6-50 MG tablet, Take 1 tablet by mouth 2 (two) times daily. (Patient not taking: Reported on 10/11/2023), Disp: 20 tablet, Rfl: 0   simvastatin (ZOCOR) 20 MG tablet, Take 20 mg by mouth at bedtime., Disp: , Rfl:   Social History   Tobacco Use  Smoking Status Never  Smokeless Tobacco Never    Allergies  Allergen Reactions   Mometasone  Other reaction(s): Other (See Comments) Nose irritation   Objective:  There were no vitals filed for this visit. There is no height or weight on file to calculate BMI. Constitutional Well developed. Well nourished.  Vascular Foot warm and well perfused. Capillary refill normal to all digits.   Neurologic Normal speech. Oriented to person, place, and time. Epicritic sensation to light touch grossly present bilaterally.  Dermatologic Skin healing well without signs of infection. Skin edges well coapted without signs of infection.  Mild erythema noted to the lateral incision  Orthopedic: Tenderness to palpation noted about the surgical site.   Radiographs: 3 views of skeletally the left foot: Hardware is intact  no signs of backing out or loosening noted.  Good correction alignment noted. Assessment:   1. Peroneal tendon tear, left, initial encounter   2. 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 Status post foot surgery    Plan:  Patient was evaluated and treated and all questions answered.  S/p foot surgery left -Progressing as expected post-operatively. -XR: See above -WB Status: Nonweightbearing in left lower extremity -Sutures: Intact.  No clinical signs of Deis is noted no complication noted. -Medications: None -Foot redressed. -Doxycycline was dispensed for skin and soft tissue prophylaxis  No follow-ups on file.

## 2024-03-08 ENCOUNTER — Encounter: Payer: Self-pay | Admitting: Podiatry

## 2024-03-09 ENCOUNTER — Other Ambulatory Visit: Payer: Self-pay | Admitting: Podiatry

## 2024-03-09 ENCOUNTER — Ambulatory Visit (INDEPENDENT_AMBULATORY_CARE_PROVIDER_SITE_OTHER): Admitting: Podiatry

## 2024-03-09 DIAGNOSIS — S86312A Strain of muscle(s) and tendon(s) of peroneal muscle group at lower leg level, left leg, initial encounter: Secondary | ICD-10-CM | POA: Diagnosis not present

## 2024-03-09 DIAGNOSIS — Z9889 Other specified postprocedural states: Secondary | ICD-10-CM

## 2024-03-09 NOTE — Progress Notes (Signed)
 Subjective:  Patient ID: Wanda Lynch, female    DOB: [birth year],  MRN: [id number]  Chief Complaint  Patient presents with   Routine Post Op    Pt stated that she took off her soft splint last night because it was rubbing her incision site, she had some yellow drainage on her bandages. She stated that the pain is manageable and it comes and goes.       68 y.o. female returns for post-op check.  Patient states that she is doing well her pain is controlled with gabapentin and Tylenol.  She does not like taking OxyContin.  She would like to have a refill.  Bandages clean dry and intact denies any other acute complaints nonweightbearing to the left lower extremity  Review of Systems: Negative except as noted in the HPI. Denies N/V/F/Ch.  Past Medical History:  Diagnosis Date   Arthritis    CTS (carpal tunnel syndrome)    HLD (hyperlipidemia)    HTN (hypertension)    Idiopathic peripheral neuropathy     Current Outpatient Medications:    acetaminophen (TYLENOL) 500 MG tablet, Take 2 tablets (1,000 mg total) by mouth every 6 (six) hours as needed for up to 14 days (pain)., Disp: 112 tablet, Rfl: 0   CALCIUM PO, Take 1 tablet by mouth daily., Disp: , Rfl:    celecoxib (CELEBREX) 200 MG capsule, Take 1 capsule (200 mg total) by mouth 2 (two) times daily., Disp: 30 capsule, Rfl: 0   doxycycline (VIBRA-TABS) 100 MG tablet, Take 1 tablet (100 mg total) by mouth 2 (two) times daily., Disp: 20 tablet, Rfl: 0   gabapentin (NEURONTIN) 300 MG capsule, Take 1 capsule (300 mg total) by mouth 3 (three) times daily for 7 days., Disp: 21 capsule, Rfl: 0   ibuprofen (ADVIL) 800 MG tablet, Take 1 tablet (800 mg total) by mouth every 6 (six) hours as needed., Disp: 60 tablet, Rfl: 1   ketoconazole (NIZORAL) 2 % cream, Apply 1 Application topically daily., Disp: 60 g, Rfl: 2   ketotifen (ZADITOR) 0.025 % ophthalmic solution, Place 1 drop into both eyes daily., Disp: , Rfl:    methocarbamol (ROBAXIN) 750  MG tablet, Take 500 mg by mouth daily as needed (Back pain). (Patient not taking: Reported on 10/11/2023), Disp: , Rfl:    metoprolol succinate (TOPROL-XL) 25 MG 24 hr tablet, Take 25 mg by mouth daily., Disp: , Rfl:    oxybutynin (DITROPAN-XL) 10 MG 24 hr tablet, Take 10 mg by mouth daily., Disp: , Rfl:    Polyethyl Glycol-Propyl Glycol (SYSTANE) 0.4-0.3 % SOLN, Place 1 drop into both eyes daily., Disp: , Rfl:    rivaroxaban (XARELTO) 10 MG TABS tablet, Take 1 tablet (10 mg total) by mouth daily., Disp: 30 tablet, Rfl: 0   senna-docusate (SENOKOT-S) 8.6-50 MG tablet, Take 1 tablet by mouth 2 (two) times daily. (Patient not taking: Reported on 10/11/2023), Disp: 20 tablet, Rfl: 0   simvastatin (ZOCOR) 20 MG tablet, Take 20 mg by mouth at bedtime., Disp: , Rfl:   Social History   Tobacco Use  Smoking Status Never  Smokeless Tobacco Never    Allergies  Allergen Reactions   Mometasone     Other reaction(s): Other (See Comments) Nose irritation   Objective:  There were no vitals filed for this visit. There is no height or weight on file to calculate BMI. Constitutional Well developed. Well nourished.  Vascular Foot warm and well perfused. Capillary refill normal to all digits.  Neurologic Normal speech. Oriented to person, place, and time. Epicritic sensation to light touch grossly present bilaterally.  Dermatologic Skin healing well without signs of infection. Skin edges well coapted without signs of infection.  Mild erythema noted to the lateral incision  Orthopedic: Tenderness to palpation noted about the surgical site.   Radiographs: 3 views of skeletally the left foot: Hardware is intact no signs of backing out or loosening noted.  Good correction alignment noted. Assessment:   No diagnosis found.  Plan:  Patient was evaluated and treated and all questions answered.  S/p foot surgery left -Progressing as expected post-operatively. -XR: See above -WB Status:  Nonweightbearing in left lower extremity in cam boot.  Cam boot was dispensed.  She was not able to tolerate the posterior splint -Sutures: Intact.  No clinical signs of Deis is noted no complication noted. -Medications: None -Foot redressed. -Continue doxycycline  No follow-ups on file.

## 2024-03-10 ENCOUNTER — Other Ambulatory Visit: Payer: Self-pay | Admitting: Podiatry

## 2024-03-22 ENCOUNTER — Ambulatory Visit (INDEPENDENT_AMBULATORY_CARE_PROVIDER_SITE_OTHER): Payer: Medicare Other | Admitting: Podiatry

## 2024-03-22 ENCOUNTER — Encounter: Payer: Self-pay | Admitting: Podiatry

## 2024-03-22 DIAGNOSIS — S86312A Strain of muscle(s) and tendon(s) of peroneal muscle group at lower leg level, left leg, initial encounter: Secondary | ICD-10-CM

## 2024-03-22 DIAGNOSIS — Q6672 Congenital pes cavus, left foot: Secondary | ICD-10-CM

## 2024-03-22 DIAGNOSIS — S92352K Displaced fracture of fifth metatarsal bone, left foot, subsequent encounter for fracture with nonunion: Secondary | ICD-10-CM

## 2024-03-22 NOTE — Progress Notes (Signed)
  Subjective:  Patient ID: Wanda Lynch, female    DOB: 11-19-1956,  MRN: [id number]  Chief Complaint  Patient presents with   Routine Post Op    POV # 2 DOS 03/01/24 --- REPAIR OF PERONEAL TENDONS, POSSIBLE GRAFT AND TRANSFER, REMOVAL OF FRACTURE FRAGMENT , BONE CUT IN HEEL, BONE AND METATARSAL , CALF MUSCLE LEGTHENING "It's doing okay but that boot takes a lot out of me.  I don't have any pain.  The boot just rubs against the incision."     68 y.o. female returns for post-op check.   Review of Systems: Negative except as noted in the HPI. Denies N/V/F/Ch.   Objective:  There were no vitals filed for this visit. There is no height or weight on file to calculate BMI. Constitutional Well developed. Well nourished.  Vascular Foot warm and well perfused. Capillary refill normal to all digits.  Calf is soft and supple, no posterior calf or knee pain, negative Homans' sign  Neurologic Normal speech. Oriented to person, place, and time. Epicritic sensation to light touch grossly present bilaterally.  Dermatologic Skin healing well without signs of infection. Skin edges well coapted without signs of infection.  Orthopedic: Tenderness to palpation noted about the surgical site.  Moderate edema    Assessment:   1. Peroneal tendon tear, left, initial encounter   2. 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 Pes cavus of left foot    Plan:  Patient was evaluated and treated and all questions answered.  S/p foot surgery left -Progressing as expected post-operatively.  Sutures and staples removed today.  May bathe.  Should maintain CAM boot 24/7.  Compression sleeve applied and she should continue to use this and icing.  Continue nonweightbearing with knee scooter.  Return in 3 weeks for new radiographs and then we can begin weightbearing and PT    Return in about 3 weeks (around 04/12/2024) for post op (new x-rays).

## 2024-04-12 ENCOUNTER — Encounter: Payer: Self-pay | Admitting: Podiatry

## 2024-04-12 ENCOUNTER — Ambulatory Visit (INDEPENDENT_AMBULATORY_CARE_PROVIDER_SITE_OTHER): Payer: Medicare Other | Admitting: Podiatry

## 2024-04-12 ENCOUNTER — Ambulatory Visit (INDEPENDENT_AMBULATORY_CARE_PROVIDER_SITE_OTHER)

## 2024-04-12 DIAGNOSIS — S86312A Strain of muscle(s) and tendon(s) of peroneal muscle group at lower leg level, left leg, initial encounter: Secondary | ICD-10-CM | POA: Diagnosis not present

## 2024-04-12 DIAGNOSIS — S92352K Displaced fracture of fifth metatarsal bone, left foot, subsequent encounter for fracture with nonunion: Secondary | ICD-10-CM

## 2024-04-12 DIAGNOSIS — Q6672 Congenital pes cavus, left foot: Secondary | ICD-10-CM

## 2024-04-12 NOTE — Progress Notes (Signed)
  Subjective:  Patient ID: Wanda Lynch, female    DOB: 1956/12/07,  MRN: [id number]  Chief Complaint  Patient presents with   Routine Post Op    POV # 3 DOS 03/01/24 --- REPAIR OF PERONEAL TENDONS, POSSIBLE GRAFT AND TRANSFER, REMOVAL OF FRACTURE FRAGMENT , BONE CUT IN HEEL, BONE AND METATARSAL , CALF MUSCLE LEGTHENING     68 y.o. female returns for post-op check.  Doing well pain is controlled  Review of Systems: Negative except as noted in the HPI. Denies N/V/F/Ch.   Objective:  There were no vitals filed for this visit. There is no height or weight on file to calculate BMI. Constitutional Well developed. Well nourished.  Vascular Foot warm and well perfused. Capillary refill normal to all digits.  Calf is soft and supple, no posterior calf or knee pain, negative Homans' sign  Neurologic Normal speech. Oriented to person, place, and time. Epicritic sensation to light touch grossly present bilaterally.  Dermatologic Skin healing well without signs of infection. Skin edges well coapted without signs of infection.  Orthopedic: Tenderness to palpation noted about the surgical site.  Moderate edema   Casting today show good consolidation across calcaneal osteotomy site, osteotomy of first metatarsal still visible  Assessment:   1. Peroneal tendon tear, left, initial encounter    Plan:  Patient was evaluated and treated and all questions answered.  S/p foot surgery left - Continue knee scooter and CAM boot for 2 more weeks and then may be gradually weightbearing in boot with assistance of walker.  Return in 4 weeks for new x-rays.  Would like her to resume physical therapy when she will return to [facility].  Referral will be sent and she will call to schedule    Return in about 4 weeks (around 05/10/2024) for post op (new x-rays).

## 2024-04-18 ENCOUNTER — Telehealth: Payer: Self-pay

## 2024-04-18 NOTE — Telephone Encounter (Signed)
-----   Message from Floyce Hutching sent at 04/12/2024 12:16 PM EDT ----- Another [facility] referral if you can send

## 2024-04-18 NOTE — Telephone Encounter (Signed)
 Referral, office note and demographics faxed to [facility] on [address]

## 2024-05-10 ENCOUNTER — Ambulatory Visit (INDEPENDENT_AMBULATORY_CARE_PROVIDER_SITE_OTHER)

## 2024-05-10 ENCOUNTER — Ambulatory Visit (INDEPENDENT_AMBULATORY_CARE_PROVIDER_SITE_OTHER): Admitting: Podiatry

## 2024-05-10 ENCOUNTER — Encounter: Payer: Self-pay | Admitting: Podiatry

## 2024-05-10 DIAGNOSIS — S92352K Displaced fracture of fifth metatarsal bone, left foot, subsequent encounter for fracture with nonunion: Secondary | ICD-10-CM

## 2024-05-10 DIAGNOSIS — Q6672 Congenital pes cavus, left foot: Secondary | ICD-10-CM

## 2024-05-10 DIAGNOSIS — S86312A Strain of muscle(s) and tendon(s) of peroneal muscle group at lower leg level, left leg, initial encounter: Secondary | ICD-10-CM

## 2024-05-10 NOTE — Patient Instructions (Addendum)
 Call to schedule PT: [phone] [address]

## 2024-05-10 NOTE — Progress Notes (Signed)
  Subjective:  Patient ID: Wanda Lynch, female    DOB: 1956/07/04,  MRN: [id number]  Chief Complaint  Patient presents with   Routine Post Op    POV # 4 DOS 03/01/24 --- REPAIR OF PERONEAL TENDONS, POSSIBLE GRAFT AND TRANSFER, REMOVAL OF FRACTURE FRAGMENT , BONE CUT IN HEEL, BONE AND METATARSAL , CALF MUSCLE LEGTHENING "It feels better.  If I walk too much, my heel bothers me.  The side feels tender.  I don't know why.  I haven't done any therapy."     68 y.o. female returns for post-op check.  Doing well  Review of Systems: Negative except as noted in the HPI. Denies N/V/F/Ch.   Objective:  There were no vitals filed for this visit. There is no height or weight on file to calculate BMI. Constitutional Well developed. Well nourished.  Vascular Foot warm and well perfused. Capillary refill normal to all digits.  Calf is soft and supple, no posterior calf or knee pain, negative Homans' sign  Neurologic Normal speech. Oriented to person, place, and time. Epicritic sensation to light touch grossly present bilaterally.  Dermatologic Incision well-healed not hypertrophic  Orthopedic: Mild pain to palpation noted about the surgical site over the peroneals not over the first met osteotomy.  Mild edema   X-rays taken today of the left foot shows good consolidation across the osteotomy site some residual healing happening at the first metatarsal site  Assessment:   1. 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 Peroneal tendon tear, left, initial encounter   3. Pes cavus of left foot    Plan:  Patient was evaluated and treated and all questions answered.  S/p foot surgery left - Has improved and start physical therapy today.  May transition to a lace up Tri-Lock style ankle brace and supportive sneaker at this point gradually.  This was dispensed today.  Follow-up in 6 weeks for new radiographs and transition to regular shoe gear at  that point after PT   Return in about 6 weeks (around 06/21/2024) for follow up from surgery (new foot xrays including calc axial).

## 2024-06-21 ENCOUNTER — Ambulatory Visit: Admitting: Podiatry

## 2024-06-21 ENCOUNTER — Ambulatory Visit (INDEPENDENT_AMBULATORY_CARE_PROVIDER_SITE_OTHER)

## 2024-06-21 ENCOUNTER — Encounter: Payer: Self-pay | Admitting: Podiatry

## 2024-06-21 VITALS — Ht 60.0 in | Wt 193.8 lb

## 2024-06-21 DIAGNOSIS — S92352K Displaced fracture of fifth metatarsal bone, left foot, subsequent encounter for fracture with nonunion: Secondary | ICD-10-CM | POA: Diagnosis not present

## 2024-06-21 DIAGNOSIS — Q6672 Congenital pes cavus, left foot: Secondary | ICD-10-CM | POA: Diagnosis not present

## 2024-06-21 DIAGNOSIS — S86312A Strain of muscle(s) and tendon(s) of peroneal muscle group at lower leg level, left leg, initial encounter: Secondary | ICD-10-CM

## 2024-06-21 MED ORDER — IBUPROFEN 800 MG PO TABS: 800.0000 mg | ORAL_TABLET | Freq: Three times a day (TID) | ORAL | 1 refills | Status: AC | PRN

## 2024-06-21 NOTE — Progress Notes (Signed)
  Subjective:  Patient ID: Wanda Lynch, female    DOB: December 14, 1956,  MRN: [id number]  Chief Complaint  Patient presents with   Post-op Follow-up    Rm 9 Patient is here for post-op follow. Patient states intermittent swelling and discomfort (pulling sensation) on the lateral side of the right foot (by surgical site) when not wearing compression sock. Patient states without compression sock and brace on right foot, she feels her gait is unstable.     68 y.o. female returns for post-op check.  Doing well, notes improvement still has some tenderness and pulling on the lateral side, she completed physical therapy.  The brace was starting to hurt healed so now she goes without the brace  Review of Systems: Negative except as noted in the HPI. Denies N/V/F/Ch.   Objective:  There were no vitals filed for this visit. Body mass index is 37.85 kg/m. Constitutional Well developed. Well nourished.  Vascular Foot warm and well perfused. Capillary refill normal to all digits.  Calf is soft and supple, no posterior calf or knee pain, negative Homans' sign  Neurologic Normal speech. Oriented to person, place, and time. Epicritic sensation to light touch grossly present bilaterally.  Dermatologic Incision well-healed not hypertrophic  Orthopedic: Today mild tenderness over the distal portion of the incision, no pain over the dorsal midfoot no gross instability of ankle   X-rays taken today of the left foot shows good consolidation across heel osteotomy site, majority of first metatarsal osteotomy is healed with some dorsal remaining  Assessment:   1. 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 Peroneal tendon tear, left, initial encounter   3. Pes cavus of left foot    Plan:  Patient was evaluated and treated and all questions answered.  S/p foot surgery left - X-rays reviewed, she may go without the brace now and utilize a compression  sock as supportive shoe only.  I refilled her ibuprofen  use as needed she has an upcoming trip to [place].  I discussed with is likely is going to take about 6 months to a year for full recovery from the surgery.  I will see her back in 3 months to reevaluate at the 52-month mark.   Return in about 3 months (around 09/21/2024) for surgery follow up (new xrays of foot).

## 2024-09-20 ENCOUNTER — Ambulatory Visit (INDEPENDENT_AMBULATORY_CARE_PROVIDER_SITE_OTHER)

## 2024-09-20 ENCOUNTER — Ambulatory Visit: Admitting: Podiatry

## 2024-09-20 VITALS — Ht 60.0 in | Wt 193.8 lb

## 2024-09-20 DIAGNOSIS — Q6672 Congenital pes cavus, left foot: Secondary | ICD-10-CM

## 2024-09-20 DIAGNOSIS — S86312A Strain of muscle(s) and tendon(s) of peroneal muscle group at lower leg level, left leg, initial encounter: Secondary | ICD-10-CM

## 2024-09-20 DIAGNOSIS — Z9889 Other specified postprocedural states: Secondary | ICD-10-CM | POA: Diagnosis not present

## 2024-09-20 DIAGNOSIS — S92352K Displaced fracture of fifth metatarsal bone, left foot, subsequent encounter for fracture with nonunion: Secondary | ICD-10-CM | POA: Diagnosis not present

## 2024-09-21 NOTE — Progress Notes (Signed)
  Subjective:  Patient ID: Wanda Lynch, female    DOB: 1956/02/16,  MRN: [id number]  Chief Complaint  Patient presents with   Post-op Follow-up    Rm 1 Pt is for a post-op f/u. Pt states tenderness after prolonged standing/walking. Pt uses compression socks to aid with left foot swelling.     68 y.o. female returns for post-op check.  Doing well, still has some swelling and pulling on the lateral side has some pain over the dorsal midfoot occasionally  Review of Systems: Negative except as noted in the HPI. Denies N/V/F/Ch.   Objective:  There were no vitals filed for this visit. Body mass index is 37.85 kg/m. Constitutional Well developed. Well nourished.  Vascular Foot warm and well perfused. Capillary refill normal to all digits.  Calf is soft and supple, no posterior calf or knee pain, negative Homans' sign  Neurologic Normal speech. Oriented to person, place, and time. Epicritic sensation to light touch grossly present bilaterally.  Dermatologic Incision well-healed not hypertrophic  Orthopedic: Today mild tenderness over the dorsal staple, no instability noted, she has some tenderness at the distal portion of the lateral incision   X-rays taken today of the left foot shows good consolidation across heel osteotomy site, majority of first metatarsal osteotomy is healed with some dorsal remaining changes since last visit  Assessment:   1. 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 Peroneal tendon tear, left, initial encounter   3. Pes cavus of left foot    Plan:  Patient was evaluated and treated and all questions answered.  S/p foot surgery left - Overall still doing well and is tolerating regular shoe gear and activity with minimal pain.  Does have some remaining consolidation across the osteotomy site which may continue to improve.  There is no loosening of the implant or change in alignment.  I discussed with her  if this does not improve with the staples.  Follow-up in 6 months to reevaluate   No follow-ups on file.

## 2024-12-18 ENCOUNTER — Other Ambulatory Visit: Payer: Self-pay | Admitting: Family Medicine

## 2024-12-18 DIAGNOSIS — Z1231 Encounter for screening mammogram for malignant neoplasm of breast: Secondary | ICD-10-CM

## 2025-01-05 ENCOUNTER — Ambulatory Visit
Admission: RE | Admit: 2025-01-05 | Discharge: 2025-01-05 | Disposition: A | Source: Ambulatory Visit | Attending: Family Medicine | Admitting: Family Medicine

## 2025-01-05 DIAGNOSIS — Z1231 Encounter for screening mammogram for malignant neoplasm of breast: Secondary | ICD-10-CM | POA: Insufficient documentation

## 2025-03-21 ENCOUNTER — Ambulatory Visit: Admitting: Podiatry
